# Patient Record
Sex: Female | Born: 1965 | Race: White | Hispanic: No | Marital: Married | Smoking: Former smoker
Health system: Southern US, Community
[De-identification: ages and names within clinical notes are randomized; demographics above are authoritative.]

## PROBLEM LIST (undated history)

## (undated) DIAGNOSIS — F419 Anxiety disorder, unspecified: Secondary | ICD-10-CM

## (undated) DIAGNOSIS — F329 Major depressive disorder, single episode, unspecified: Secondary | ICD-10-CM

## (undated) DIAGNOSIS — E079 Disorder of thyroid, unspecified: Secondary | ICD-10-CM

## (undated) DIAGNOSIS — C801 Malignant (primary) neoplasm, unspecified: Secondary | ICD-10-CM

## (undated) DIAGNOSIS — R011 Cardiac murmur, unspecified: Secondary | ICD-10-CM

## (undated) DIAGNOSIS — G8929 Other chronic pain: Secondary | ICD-10-CM

## (undated) DIAGNOSIS — Z87442 Personal history of urinary calculi: Secondary | ICD-10-CM

## (undated) DIAGNOSIS — I341 Nonrheumatic mitral (valve) prolapse: Secondary | ICD-10-CM

## (undated) DIAGNOSIS — J449 Chronic obstructive pulmonary disease, unspecified: Secondary | ICD-10-CM

## (undated) DIAGNOSIS — K649 Unspecified hemorrhoids: Secondary | ICD-10-CM

## (undated) DIAGNOSIS — R569 Unspecified convulsions: Secondary | ICD-10-CM

## (undated) DIAGNOSIS — J189 Pneumonia, unspecified organism: Secondary | ICD-10-CM

## (undated) DIAGNOSIS — F32A Depression, unspecified: Secondary | ICD-10-CM

## (undated) HISTORY — PX: BACK SURGERY: SHX140

## (undated) HISTORY — DX: Anxiety disorder, unspecified: F41.9

## (undated) HISTORY — DX: Chronic obstructive pulmonary disease, unspecified: J44.9

## (undated) HISTORY — PX: WISDOM TOOTH EXTRACTION: SHX21

## (undated) HISTORY — DX: Disorder of thyroid, unspecified: E07.9

---

## 2000-09-14 ENCOUNTER — Encounter: Payer: Self-pay | Admitting: *Deleted

## 2000-09-14 ENCOUNTER — Emergency Department (HOSPITAL_COMMUNITY): Admission: EM | Admit: 2000-09-14 | Discharge: 2000-09-14 | Payer: Self-pay | Admitting: *Deleted

## 2000-12-07 ENCOUNTER — Encounter: Payer: Self-pay | Admitting: Emergency Medicine

## 2000-12-07 ENCOUNTER — Emergency Department (HOSPITAL_COMMUNITY): Admission: EM | Admit: 2000-12-07 | Discharge: 2000-12-07 | Payer: Self-pay | Admitting: Emergency Medicine

## 2001-02-14 ENCOUNTER — Emergency Department (HOSPITAL_COMMUNITY): Admission: EM | Admit: 2001-02-14 | Discharge: 2001-02-14 | Payer: Self-pay | Admitting: *Deleted

## 2001-04-27 ENCOUNTER — Observation Stay (HOSPITAL_COMMUNITY): Admission: EM | Admit: 2001-04-27 | Discharge: 2001-04-27 | Payer: Self-pay | Admitting: Emergency Medicine

## 2002-11-06 ENCOUNTER — Other Ambulatory Visit: Admission: RE | Admit: 2002-11-06 | Discharge: 2002-11-06 | Payer: Self-pay | Admitting: *Deleted

## 2003-03-19 ENCOUNTER — Emergency Department (HOSPITAL_COMMUNITY): Admission: EM | Admit: 2003-03-19 | Discharge: 2003-03-19 | Payer: Self-pay | Admitting: Emergency Medicine

## 2005-11-30 ENCOUNTER — Other Ambulatory Visit: Admission: RE | Admit: 2005-11-30 | Discharge: 2005-11-30 | Payer: Self-pay | Admitting: Unknown Physician Specialty

## 2005-11-30 ENCOUNTER — Encounter (INDEPENDENT_AMBULATORY_CARE_PROVIDER_SITE_OTHER): Payer: Self-pay | Admitting: *Deleted

## 2005-11-30 ENCOUNTER — Encounter (INDEPENDENT_AMBULATORY_CARE_PROVIDER_SITE_OTHER): Payer: Self-pay | Admitting: Specialist

## 2007-02-12 ENCOUNTER — Inpatient Hospital Stay (HOSPITAL_COMMUNITY): Admission: AD | Admit: 2007-02-12 | Discharge: 2007-02-12 | Payer: Self-pay | Admitting: Obstetrics and Gynecology

## 2010-09-11 NOTE — Discharge Summary (Signed)
[facility]  Patient:    Yolanda [name], Yolanda [name] Visit Number: [id number] MRN: [id number]          Service Type: OBS Location: 4A [id number] Attending Physician:  Jonnie Kind Dictated by:   Mallory Shirk, M.D. Admit Date:  04/26/2001 Discharge Date: 04/27/2001                             Discharge Summary  OBSERVATION TIME:  10 hours on April 27, 2001.  HOSPITAL COURSE:  This 45 year old female, gravida 3, para 1, abortion 1, LMP March 17, 2001, placing her at 5-1/[redacted] weeks gestation was admitted at midnight for observation for threatened abortion after a second emergency room visit this pregnancy for complaints of abdominal pain and bleeding.  She was seen on March 23, 2001, late at [facility] and had a quantitative hCG of 493 with pelvic ultrasound showing a thin endometrium, normal adnexa with a right ovarian cyst approximately 3 cm diameter.  The patient had no evidence of ectopic pregnancy.  She was sent home and brought to the emergency room by EMS ambulance transport.  Late on the afternoon of April 26, 2001, at approximately 10 p.m. after beginning to show some bleeding and increased abdominal pain, the patient was evaluated in the emergency room with laboratory data including a hemoglobin 13, hematocrit 34.0, blood type A+, white count 8,400, with quantitative hCG 548.  Examination in the emergency room showed the cervix to be closed with menstrual type blood present. She was observed overnight for evolution of miscarriage to be considered for dilatation and curettage.  PAST MEDICAL HISTORY:  History of seizures, not currently on medicines.  PAST SURGICAL HISTORY:  Negative.  CURRENT MEDICATIONS: None.  ALLERGIES:  CODEINE causes a rash.  HABITS:  1 pack per day cigarettes.  Denies alcohol or other recreational drugs.  PHYSICAL EXAMINATION:  GENERAL:  Height 5 feet 6 inches.  Estimated weight 120 pounds.  VITAL SIGNS:   Pulse 78, respirations 18, blood pressure 135/80.  GENERAL:  She is a slim Caucasian female, obvious smoker, mild discomfort.  HEENT:  Pupils, equal, round, reactive to light and accommodation. Extraocular movements are intact.  CHEST:  Clear.  CARDIAC:  Regular rate and rhythm.  ABDOMEN:  Without masses.  EXTERNAL GENITALIA:  Normal female.  VAGINAL EXAMINATION AT 11 A.M.:  Shows increased menstrual blood and tissue fragments having extruded into the vault easily extracted from the os.  ADMITTING DIAGNOSES:  Spontaneous abortion.  PLAN:  The patient was given options of proceeding with dilatation and curettage to confirm that uterus is evacuated.  Given the ease with which tissue is being expressed and the patient is inclined to allow this miscarriage to spontaneously resolve without surgical procedure.  Blood type as mentioned is A+.  The patient will be discharged home on Motrin 800 mg IV q.8h. and Tylox 1 q.4h. p.r.n. pain, #15 tablets.  FOLLOWUP:  She will be followed up in 1 week at Dr. Awilda Bill office. Dictated by:   Mallory Shirk, M.D. Attending Physician:  Jonnie Kind DD:  04/27/01 TD:  04/27/01 Job: [id number]

## 2011-02-03 LAB — KLEIHAUER-BETKE STAIN
Fetal Cells %: 0
Quantitation Fetal Hemoglobin: 0

## 2011-02-03 LAB — CBC
HCT: 33.1 — ABNORMAL LOW
Hemoglobin: 11.7 — ABNORMAL LOW
MCHC: 35.4
MCV: 98
Platelets: 218
RBC: 3.38 — ABNORMAL LOW
RDW: 13.4
WBC: 10.6 — ABNORMAL HIGH

## 2011-03-30 ENCOUNTER — Other Ambulatory Visit: Payer: Self-pay | Admitting: Nurse Practitioner

## 2011-03-30 ENCOUNTER — Other Ambulatory Visit (HOSPITAL_COMMUNITY)
Admission: RE | Admit: 2011-03-30 | Discharge: 2011-03-30 | Disposition: A | Discharge status: Home / Self Care | Payer: Self-pay | Source: Ambulatory Visit | Attending: Unknown Physician Specialty | Admitting: Unknown Physician Specialty

## 2011-03-30 ENCOUNTER — Other Ambulatory Visit (HOSPITAL_COMMUNITY)
Admission: RE | Admit: 2011-03-30 | Discharge: 2011-03-30 | Disposition: A | Discharge status: Home / Self Care | Payer: Self-pay | Source: Ambulatory Visit | Attending: Family Medicine | Admitting: Family Medicine

## 2011-03-30 DIAGNOSIS — R87612 Low grade squamous intraepithelial lesion on cytologic smear of cervix (LGSIL): Secondary | ICD-10-CM | POA: Insufficient documentation

## 2012-01-04 ENCOUNTER — Other Ambulatory Visit (HOSPITAL_COMMUNITY)
Admission: RE | Admit: 2012-01-04 | Discharge: 2012-01-04 | Disposition: A | Discharge status: Home / Self Care | Payer: Self-pay | Source: Ambulatory Visit | Attending: Unknown Physician Specialty | Admitting: Unknown Physician Specialty

## 2012-01-04 ENCOUNTER — Ambulatory Visit (HOSPITAL_COMMUNITY): Admission: RE | Admit: 2012-01-04 | Payer: Self-pay | Source: Ambulatory Visit | Admitting: Nurse Practitioner

## 2012-01-04 DIAGNOSIS — R87610 Atypical squamous cells of undetermined significance on cytologic smear of cervix (ASC-US): Secondary | ICD-10-CM | POA: Insufficient documentation

## 2012-01-04 DIAGNOSIS — N72 Inflammatory disease of cervix uteri: Secondary | ICD-10-CM | POA: Insufficient documentation

## 2012-10-19 DIAGNOSIS — R569 Unspecified convulsions: Secondary | ICD-10-CM

## 2013-04-26 HISTORY — PX: COLONOSCOPY: SHX174

## 2015-01-21 ENCOUNTER — Encounter (HOSPITAL_COMMUNITY): Payer: Self-pay | Admitting: Emergency Medicine

## 2015-01-21 ENCOUNTER — Emergency Department (HOSPITAL_COMMUNITY)
Admission: EM | Admit: 2015-01-21 | Discharge: 2015-01-21 | Disposition: A | Discharge status: Home / Self Care | Payer: Self-pay | Attending: Emergency Medicine | Admitting: Emergency Medicine

## 2015-01-21 DIAGNOSIS — M5116 Intervertebral disc disorders with radiculopathy, lumbar region: Secondary | ICD-10-CM | POA: Insufficient documentation

## 2015-01-21 DIAGNOSIS — Z720 Tobacco use: Secondary | ICD-10-CM | POA: Insufficient documentation

## 2015-01-21 DIAGNOSIS — Z8659 Personal history of other mental and behavioral disorders: Secondary | ICD-10-CM | POA: Insufficient documentation

## 2015-01-21 DIAGNOSIS — G8929 Other chronic pain: Secondary | ICD-10-CM | POA: Insufficient documentation

## 2015-01-21 HISTORY — DX: Unspecified convulsions: R56.9

## 2015-01-21 HISTORY — DX: Major depressive disorder, single episode, unspecified: F32.9

## 2015-01-21 HISTORY — DX: Depression, unspecified: F32.A

## 2015-01-21 HISTORY — DX: Other chronic pain: G89.29

## 2015-01-21 MED ORDER — OXYCODONE-ACETAMINOPHEN 5-325 MG PO TABS: 1.0000 | ORAL_TABLET | Freq: Three times a day (TID) | ORAL | Status: DC | PRN

## 2015-01-21 MED ORDER — METHYLPREDNISOLONE 4 MG PO TBPK: ORAL_TABLET | ORAL | Status: DC

## 2015-01-21 MED ORDER — DIAZEPAM 5 MG PO TABS: 5.0000 mg | ORAL_TABLET | Freq: Two times a day (BID) | ORAL | Status: DC

## 2015-01-21 MED ORDER — DEXAMETHASONE SODIUM PHOSPHATE 10 MG/ML IJ SOLN
10.0000 mg | Freq: Once | INTRAMUSCULAR | Status: AC
  Administered 2015-01-21: 10 mg via INTRAMUSCULAR
  Filled 2015-01-21: qty 1

## 2015-01-21 NOTE — ED Provider Notes (Signed)
CSN: [id number]     Arrival date & time 01/21/15  1626 History  By signing my name below, I, Julien Nordmann, attest that this documentation has been prepared under the direction and in the presence of Etta Quill, NP. Electronically Signed: Julien Nordmann, ED Scribe. 01/21/2015. 5:06 PM.    Chief Complaint  Patient presents with  . Back Pain  . Leg Pain      Patient is a 49 y.o. female presenting with back pain and leg pain. The history is provided by the patient. No language interpreter was used.  Back Pain Location:  Unable to specify Quality:  Unable to specify Radiates to:  L posterior upper leg and L knee Pain severity:  Severe Pain is:  Unable to specify Onset quality:  Gradual Duration:  2 months Timing:  Intermittent Progression:  Worsening Chronicity:  Recurrent Context: falling   Relieved by:  Nothing Worsened by:  Ambulation and movement Associated symptoms: leg pain, numbness and weight loss   Associated symptoms: no bladder incontinence and no bowel incontinence   Leg Pain Associated symptoms: back pain    HPI Comments: Marcille Barman is a 49 y.o. female who has a hx of depression and seizures presents to the Emergency Department complaining of intermittent, gradual worsening back pain for the past 2 months. She was seen by [facility] and had an MRI done and was told she has a bulging disk that she needs operated on. Pt reports falling 3 times today due to her left leg giving out. She notes she has no feeling in her left foot. Pt ambulates with a cane. She denies bladder/bowel incontinence.   Past Medical History  Diagnosis Date  . Chronic pain   . Seizures   . Depression    History reviewed. No pertinent past surgical history. History reviewed. No pertinent family history. Social History  Substance Use Topics  . Smoking status: Current Every Day Smoker  . Smokeless tobacco: None  . Alcohol Use: No   OB History    No data available      Review of Systems  Constitutional: Positive for weight loss.  Gastrointestinal: Negative for bowel incontinence.  Genitourinary: Negative for bladder incontinence.  Musculoskeletal: Positive for back pain.  Neurological: Positive for numbness.  All other systems reviewed and are negative.     Allergies  Codeine  Home Medications   Prior to Admission medications   Not on File   Triage vitals: BP 102/72 mmHg  Pulse 79  Temp(Src) 97.9 F (36.6 C) (Oral)  Resp 18  SpO2 100% Physical Exam  Constitutional: She appears well-developed and well-nourished. No distress.  HENT:  Head: Normocephalic and atraumatic.  Eyes: Conjunctivae are normal. Pupils are equal, round, and reactive to light. Right eye exhibits no discharge. Left eye exhibits no discharge.  Neck: Neck supple.  Cardiovascular: Normal rate, regular rhythm, normal heart sounds and intact distal pulses.   Pulmonary/Chest: Effort normal and breath sounds normal. No respiratory distress.  Abdominal: Soft. There is no tenderness.  Lymphadenopathy:    She has no cervical adenopathy.  Neurological: She is alert. Coordination normal.  Skin: Skin is warm and dry. No rash noted. She is not diaphoretic.  Psychiatric: She has a normal mood and affect. Her behavior is normal.  Nursing note and vitals reviewed.   ED Course  Procedures  DIAGNOSTIC STUDIES: Oxygen Saturation is 100% on RA, normal by my interpretation.  COORDINATION OF CARE:  5:10 PM Discussed treatment plan with pt at  bedside and pt agreed to plan.  Labs Review Labs Reviewed - No data to display  Imaging Review No results found. I have personally reviewed and evaluated these images and lab results as part of my medical decision-making.   EKG Interpretation None     Patient with known history of low back pain d/t degenerative disc disease with herniation. She will need surgery, but her insurance will not be active until November.  She has been  ambulating with the assistance of a cane, but has recently been falling due to increasing pain. No bladder or bowel incontinence. Decreased strength of lower extremities bilaterally. Patient provided with rolling walker by care management. Given decadron in ED. RX for medrol dose pack, valium, and percocet. Patient is able to ambulate with assistance of walker. Return precautions discussed. MDM   Final diagnoses:  None    Lumbar radiculopathy. Degenerative disc disease with herniation.   I personally performed the services described in this documentation, which was scribed in my presence. The recorded information has been reviewed and is accurate.   Etta Quill, NP 01/22/15 0120  Virgel Manifold, MD 01/25/15 [phone]

## 2015-01-21 NOTE — Discharge Instructions (Signed)
Sciatica Sciatica is pain, weakness, numbness, or tingling along the path of the sciatic nerve. The nerve starts in the lower back and runs down the back of each leg. The nerve controls the muscles in the lower leg and in the back of the knee, while also providing sensation to the back of the thigh, lower leg, and the sole of your foot. Sciatica is a symptom of another medical condition. For instance, nerve damage or certain conditions, such as a herniated disk or bone spur on the spine, pinch or put pressure on the sciatic nerve. This causes the pain, weakness, or other sensations normally associated with sciatica. Generally, sciatica only affects one side of the body. CAUSES   Herniated or slipped disc.  Degenerative disk disease.  A pain disorder involving the narrow muscle in the buttocks (piriformis syndrome).  Pelvic injury or fracture.  Pregnancy.  Tumor (rare). SYMPTOMS  Symptoms can vary from mild to very severe. The symptoms usually travel from the low back to the buttocks and down the back of the leg. Symptoms can include:  Mild tingling or dull aches in the lower back, leg, or hip.  Numbness in the back of the calf or sole of the foot.  Burning sensations in the lower back, leg, or hip.  Sharp pains in the lower back, leg, or hip.  Leg weakness.  Severe back pain inhibiting movement. These symptoms may get worse with coughing, sneezing, laughing, or prolonged sitting or standing. Also, being overweight may worsen symptoms. DIAGNOSIS  Your caregiver will perform a physical exam to look for common symptoms of sciatica. He or she may ask you to do certain movements or activities that would trigger sciatic nerve pain. Other tests may be performed to find the cause of the sciatica. These may include:  Blood tests.  X-rays.  Imaging tests, such as an MRI or CT scan. TREATMENT  Treatment is directed at the cause of the sciatic pain. Sometimes, treatment is not necessary  and the pain and discomfort goes away on its own. If treatment is needed, your caregiver may suggest:  Over-the-counter medicines to relieve pain.  Prescription medicines, such as anti-inflammatory medicine, muscle relaxants, or narcotics.  Applying heat or ice to the painful area.  Steroid injections to lessen pain, irritation, and inflammation around the nerve.  Reducing activity during periods of pain.  Exercising and stretching to strengthen your abdomen and improve flexibility of your spine. Your caregiver may suggest losing weight if the extra weight makes the back pain worse.  Physical therapy.  Surgery to eliminate what is pressing or pinching the nerve, such as a bone spur or part of a herniated disk. HOME CARE INSTRUCTIONS   Only take over-the-counter or prescription medicines for pain or discomfort as directed by your caregiver.  Apply ice to the affected area for 20 minutes, 3-4 times a day for the first 48-72 hours. Then try heat in the same way.  Exercise, stretch, or perform your usual activities if these do not aggravate your pain.  Attend physical therapy sessions as directed by your caregiver.  Keep all follow-up appointments as directed by your caregiver.  Do not wear high heels or shoes that do not provide proper support.  Check your mattress to see if it is too soft. A firm mattress may lessen your pain and discomfort. SEEK IMMEDIATE MEDICAL CARE IF:   You lose control of your bowel or bladder (incontinence).  You have increasing weakness in the lower back, pelvis, buttocks,   or legs.  You have redness or swelling of your back.  You have a burning sensation when you urinate.  You have pain that gets worse when you lie down or awakens you at night.  Your pain is worse than you have experienced in the past.  Your pain is lasting longer than 4 weeks.  You are suddenly losing weight without reason. MAKE SURE YOU:  Understand these  instructions.  Will watch your condition.  Will get help right away if you are not doing well or get worse. Document Released: 04/06/2001 Document Revised: 10/12/2011 Document Reviewed: 08/22/2011 Exit[employer] Patient Information 2015 [employer]. This information is not intended to replace advice given to you by your health care provider. Make sure you discuss any questions you have with your health care provider.  

## 2015-01-21 NOTE — ED Notes (Signed)
Pt st's she has had lower back pain x's 2 months.  St's she was seen at [facility] and had MRI on 9/13 was told she would need surg.  Pt st;'s she can't see Neurosurgeon until Nov when her insurance starts.  Pt st's she has fallen x's 3 today due to left leg being numb

## 2015-01-21 NOTE — ED Notes (Signed)
Pt sts chronic lower back pain with sciatica; pt sts increased pain today down left side; pt sts some fall today due to pain and has some numbness; pt mae

## 2016-01-29 ENCOUNTER — Other Ambulatory Visit: Payer: Self-pay | Admitting: Advanced Practice Midwife

## 2016-02-05 ENCOUNTER — Other Ambulatory Visit: Payer: Self-pay | Admitting: Advanced Practice Midwife

## 2016-02-05 ENCOUNTER — Encounter: Payer: Self-pay | Admitting: Advanced Practice Midwife

## 2016-03-31 DIAGNOSIS — M5416 Radiculopathy, lumbar region: Secondary | ICD-10-CM | POA: Diagnosis not present

## 2016-03-31 DIAGNOSIS — M5137 Other intervertebral disc degeneration, lumbosacral region: Secondary | ICD-10-CM | POA: Diagnosis not present

## 2016-05-06 DIAGNOSIS — M5416 Radiculopathy, lumbar region: Secondary | ICD-10-CM | POA: Diagnosis not present

## 2016-05-06 DIAGNOSIS — R030 Elevated blood-pressure reading, without diagnosis of hypertension: Secondary | ICD-10-CM | POA: Diagnosis not present

## 2016-06-02 DIAGNOSIS — R030 Elevated blood-pressure reading, without diagnosis of hypertension: Secondary | ICD-10-CM | POA: Diagnosis not present

## 2016-06-02 DIAGNOSIS — M5137 Other intervertebral disc degeneration, lumbosacral region: Secondary | ICD-10-CM | POA: Diagnosis not present

## 2016-06-02 DIAGNOSIS — M5416 Radiculopathy, lumbar region: Secondary | ICD-10-CM | POA: Diagnosis not present

## 2016-08-12 DIAGNOSIS — H04123 Dry eye syndrome of bilateral lacrimal glands: Secondary | ICD-10-CM | POA: Diagnosis not present

## 2016-08-26 DIAGNOSIS — M5416 Radiculopathy, lumbar region: Secondary | ICD-10-CM | POA: Diagnosis not present

## 2016-08-26 DIAGNOSIS — M79672 Pain in left foot: Secondary | ICD-10-CM | POA: Diagnosis not present

## 2016-08-26 DIAGNOSIS — R030 Elevated blood-pressure reading, without diagnosis of hypertension: Secondary | ICD-10-CM | POA: Diagnosis not present

## 2016-08-26 DIAGNOSIS — M5137 Other intervertebral disc degeneration, lumbosacral region: Secondary | ICD-10-CM | POA: Diagnosis not present

## 2016-11-24 DIAGNOSIS — M5137 Other intervertebral disc degeneration, lumbosacral region: Secondary | ICD-10-CM | POA: Diagnosis not present

## 2016-11-24 DIAGNOSIS — R030 Elevated blood-pressure reading, without diagnosis of hypertension: Secondary | ICD-10-CM | POA: Diagnosis not present

## 2016-11-24 DIAGNOSIS — M5416 Radiculopathy, lumbar region: Secondary | ICD-10-CM | POA: Diagnosis not present

## 2017-01-28 DIAGNOSIS — Z23 Encounter for immunization: Secondary | ICD-10-CM | POA: Diagnosis not present

## 2017-02-04 DIAGNOSIS — R55 Syncope and collapse: Secondary | ICD-10-CM | POA: Diagnosis not present

## 2017-02-04 DIAGNOSIS — R569 Unspecified convulsions: Secondary | ICD-10-CM | POA: Diagnosis not present

## 2017-02-04 DIAGNOSIS — F445 Conversion disorder with seizures or convulsions: Secondary | ICD-10-CM | POA: Diagnosis not present

## 2017-02-04 DIAGNOSIS — R0789 Other chest pain: Secondary | ICD-10-CM | POA: Diagnosis not present

## 2017-02-22 DIAGNOSIS — M5416 Radiculopathy, lumbar region: Secondary | ICD-10-CM | POA: Diagnosis not present

## 2017-02-22 DIAGNOSIS — R030 Elevated blood-pressure reading, without diagnosis of hypertension: Secondary | ICD-10-CM | POA: Diagnosis not present

## 2017-02-22 DIAGNOSIS — M542 Cervicalgia: Secondary | ICD-10-CM | POA: Diagnosis not present

## 2017-02-22 DIAGNOSIS — M5137 Other intervertebral disc degeneration, lumbosacral region: Secondary | ICD-10-CM | POA: Diagnosis not present

## 2017-03-28 DIAGNOSIS — M5137 Other intervertebral disc degeneration, lumbosacral region: Secondary | ICD-10-CM | POA: Diagnosis not present

## 2017-03-28 DIAGNOSIS — M542 Cervicalgia: Secondary | ICD-10-CM | POA: Diagnosis not present

## 2017-03-28 DIAGNOSIS — R030 Elevated blood-pressure reading, without diagnosis of hypertension: Secondary | ICD-10-CM | POA: Diagnosis not present

## 2017-03-28 DIAGNOSIS — M5416 Radiculopathy, lumbar region: Secondary | ICD-10-CM | POA: Diagnosis not present

## 2017-06-20 DIAGNOSIS — M5126 Other intervertebral disc displacement, lumbar region: Secondary | ICD-10-CM | POA: Diagnosis not present

## 2017-06-20 DIAGNOSIS — M5416 Radiculopathy, lumbar region: Secondary | ICD-10-CM | POA: Diagnosis not present

## 2017-06-20 DIAGNOSIS — M542 Cervicalgia: Secondary | ICD-10-CM | POA: Diagnosis not present

## 2017-06-20 DIAGNOSIS — M5137 Other intervertebral disc degeneration, lumbosacral region: Secondary | ICD-10-CM | POA: Diagnosis not present

## 2017-06-30 DIAGNOSIS — R6882 Decreased libido: Secondary | ICD-10-CM | POA: Diagnosis not present

## 2017-06-30 DIAGNOSIS — Z01411 Encounter for gynecological examination (general) (routine) with abnormal findings: Secondary | ICD-10-CM | POA: Diagnosis not present

## 2017-06-30 DIAGNOSIS — N951 Menopausal and female climacteric states: Secondary | ICD-10-CM | POA: Diagnosis not present

## 2017-09-08 DIAGNOSIS — M5126 Other intervertebral disc displacement, lumbar region: Secondary | ICD-10-CM | POA: Diagnosis not present

## 2017-09-08 DIAGNOSIS — M542 Cervicalgia: Secondary | ICD-10-CM | POA: Diagnosis not present

## 2017-09-08 DIAGNOSIS — R030 Elevated blood-pressure reading, without diagnosis of hypertension: Secondary | ICD-10-CM | POA: Diagnosis not present

## 2017-09-08 DIAGNOSIS — M5416 Radiculopathy, lumbar region: Secondary | ICD-10-CM | POA: Diagnosis not present

## 2017-12-06 DIAGNOSIS — M5416 Radiculopathy, lumbar region: Secondary | ICD-10-CM | POA: Diagnosis not present

## 2017-12-06 DIAGNOSIS — M5126 Other intervertebral disc displacement, lumbar region: Secondary | ICD-10-CM | POA: Diagnosis not present

## 2017-12-06 DIAGNOSIS — M542 Cervicalgia: Secondary | ICD-10-CM | POA: Diagnosis not present

## 2017-12-06 DIAGNOSIS — R030 Elevated blood-pressure reading, without diagnosis of hypertension: Secondary | ICD-10-CM | POA: Diagnosis not present

## 2018-01-16 DIAGNOSIS — Z23 Encounter for immunization: Secondary | ICD-10-CM | POA: Diagnosis not present

## 2018-02-02 DIAGNOSIS — R918 Other nonspecific abnormal finding of lung field: Secondary | ICD-10-CM | POA: Diagnosis not present

## 2018-02-02 DIAGNOSIS — R05 Cough: Secondary | ICD-10-CM | POA: Diagnosis not present

## 2018-02-02 DIAGNOSIS — F17200 Nicotine dependence, unspecified, uncomplicated: Secondary | ICD-10-CM | POA: Diagnosis not present

## 2018-02-02 DIAGNOSIS — Z720 Tobacco use: Secondary | ICD-10-CM | POA: Diagnosis not present

## 2018-02-02 DIAGNOSIS — J189 Pneumonia, unspecified organism: Secondary | ICD-10-CM | POA: Diagnosis not present

## 2018-02-09 DIAGNOSIS — R911 Solitary pulmonary nodule: Secondary | ICD-10-CM | POA: Diagnosis not present

## 2018-02-09 DIAGNOSIS — R05 Cough: Secondary | ICD-10-CM | POA: Diagnosis not present

## 2018-02-09 DIAGNOSIS — R918 Other nonspecific abnormal finding of lung field: Secondary | ICD-10-CM | POA: Diagnosis not present

## 2018-02-10 DIAGNOSIS — E041 Nontoxic single thyroid nodule: Secondary | ICD-10-CM | POA: Diagnosis not present

## 2018-02-10 DIAGNOSIS — J449 Chronic obstructive pulmonary disease, unspecified: Secondary | ICD-10-CM | POA: Diagnosis not present

## 2018-02-10 DIAGNOSIS — R490 Dysphonia: Secondary | ICD-10-CM | POA: Diagnosis not present

## 2018-02-10 DIAGNOSIS — F17210 Nicotine dependence, cigarettes, uncomplicated: Secondary | ICD-10-CM | POA: Diagnosis not present

## 2018-02-10 DIAGNOSIS — C3412 Malignant neoplasm of upper lobe, left bronchus or lung: Secondary | ICD-10-CM | POA: Diagnosis not present

## 2018-02-10 DIAGNOSIS — R911 Solitary pulmonary nodule: Secondary | ICD-10-CM | POA: Diagnosis not present

## 2018-02-10 DIAGNOSIS — R05 Cough: Secondary | ICD-10-CM | POA: Diagnosis not present

## 2018-02-10 DIAGNOSIS — R918 Other nonspecific abnormal finding of lung field: Secondary | ICD-10-CM | POA: Diagnosis not present

## 2018-02-22 DIAGNOSIS — Z6825 Body mass index (BMI) 25.0-25.9, adult: Secondary | ICD-10-CM | POA: Diagnosis not present

## 2018-02-22 DIAGNOSIS — Z720 Tobacco use: Secondary | ICD-10-CM | POA: Diagnosis not present

## 2018-02-22 DIAGNOSIS — R911 Solitary pulmonary nodule: Secondary | ICD-10-CM | POA: Diagnosis not present

## 2018-02-23 ENCOUNTER — Other Ambulatory Visit (HOSPITAL_COMMUNITY): Payer: Self-pay | Admitting: Internal Medicine

## 2018-02-23 DIAGNOSIS — C3411 Malignant neoplasm of upper lobe, right bronchus or lung: Secondary | ICD-10-CM

## 2018-02-23 DIAGNOSIS — I10 Essential (primary) hypertension: Secondary | ICD-10-CM | POA: Diagnosis not present

## 2018-02-23 DIAGNOSIS — Z6824 Body mass index (BMI) 24.0-24.9, adult: Secondary | ICD-10-CM | POA: Diagnosis not present

## 2018-02-23 DIAGNOSIS — E041 Nontoxic single thyroid nodule: Secondary | ICD-10-CM | POA: Diagnosis not present

## 2018-02-27 DIAGNOSIS — E042 Nontoxic multinodular goiter: Secondary | ICD-10-CM | POA: Diagnosis not present

## 2018-02-27 DIAGNOSIS — E041 Nontoxic single thyroid nodule: Secondary | ICD-10-CM | POA: Diagnosis not present

## 2018-03-06 ENCOUNTER — Telehealth: Payer: Self-pay | Admitting: Medical Oncology

## 2018-03-06 ENCOUNTER — Encounter (HOSPITAL_COMMUNITY)
Admission: RE | Admit: 2018-03-06 | Discharge: 2018-03-06 | Disposition: A | Discharge status: Home / Self Care | Payer: BLUE CROSS/BLUE SHIELD | Source: Ambulatory Visit | Attending: Internal Medicine | Admitting: Internal Medicine

## 2018-03-06 DIAGNOSIS — R911 Solitary pulmonary nodule: Secondary | ICD-10-CM | POA: Diagnosis not present

## 2018-03-06 DIAGNOSIS — C3411 Malignant neoplasm of upper lobe, right bronchus or lung: Secondary | ICD-10-CM | POA: Diagnosis not present

## 2018-03-06 MED ORDER — FLUDEOXYGLUCOSE F - 18 (FDG) INJECTION
10.0000 | Freq: Once | INTRAVENOUS | Status: AC | PRN
  Administered 2018-03-06: 9.6 via INTRAVENOUS

## 2018-03-06 NOTE — Telephone Encounter (Signed)
err

## 2018-03-08 ENCOUNTER — Telehealth: Payer: Self-pay | Admitting: *Deleted

## 2018-03-08 DIAGNOSIS — Z6824 Body mass index (BMI) 24.0-24.9, adult: Secondary | ICD-10-CM | POA: Diagnosis not present

## 2018-03-08 DIAGNOSIS — K625 Hemorrhage of anus and rectum: Secondary | ICD-10-CM | POA: Diagnosis not present

## 2018-03-08 NOTE — Telephone Encounter (Signed)
Oncology Nurse Navigator Documentation  Oncology Nurse Navigator Flowsheets 03/08/2018  Navigator Location [facility]  Referral date to RadOnc/MedOnc 03/07/2018  Navigator Encounter Type Telephone/Ms. Whalley called yesterday and spoke with new patient coordinator.  She is very upset with [facility] care and wanted to be seen here.  I have CT scan but no other information.  Patient updated coordinator that she had PET scan.  I called her to get all information to help expedite care.  I was unable to reach her or leave vm message.    Telephone Incoming Call;Outgoing Call  Barriers/Navigation Needs Coordination of Care  Interventions Coordination of Care  Acuity Level 2  Time Spent with Patient 30

## 2018-03-09 ENCOUNTER — Telehealth: Payer: Self-pay | Admitting: *Deleted

## 2018-03-09 ENCOUNTER — Encounter: Payer: Self-pay | Admitting: *Deleted

## 2018-03-09 DIAGNOSIS — R030 Elevated blood-pressure reading, without diagnosis of hypertension: Secondary | ICD-10-CM | POA: Diagnosis not present

## 2018-03-09 DIAGNOSIS — M5416 Radiculopathy, lumbar region: Secondary | ICD-10-CM | POA: Diagnosis not present

## 2018-03-09 DIAGNOSIS — M5126 Other intervertebral disc displacement, lumbar region: Secondary | ICD-10-CM | POA: Diagnosis not present

## 2018-03-09 NOTE — Telephone Encounter (Signed)
Oncology Nurse Navigator Documentation  Oncology Nurse Navigator Flowsheets 03/09/2018  Navigator Location [facility]  Navigator Encounter Type Telephone/I called Ms. Bangs to follow up on her appt to be seen next week.  I asked about medical records and if she could fax to me.  She updated me that she has just recently seen PCP and she will get records to me from them and from recent hospital visit. She did state she went to [facility] to see "a cancer doctor" but states they did nothing to help her.  I updated her on appt for thoracic clinic next week and a fax number to fax me records.  She verbalized understanding and will get the information to me today.    Telephone Outgoing Call  Treatment Phase Abnormal Scans  Barriers/Navigation Needs Education;Coordination of Care  Education Other  Interventions Coordination of Care;Education  Coordination of Care Other  Education Method Verbal  Acuity Level 1  Time Spent with Patient 30

## 2018-03-11 DIAGNOSIS — R0602 Shortness of breath: Secondary | ICD-10-CM | POA: Diagnosis not present

## 2018-03-11 DIAGNOSIS — Z79899 Other long term (current) drug therapy: Secondary | ICD-10-CM | POA: Diagnosis not present

## 2018-03-11 DIAGNOSIS — J069 Acute upper respiratory infection, unspecified: Secondary | ICD-10-CM | POA: Diagnosis not present

## 2018-03-11 DIAGNOSIS — R918 Other nonspecific abnormal finding of lung field: Secondary | ICD-10-CM | POA: Diagnosis not present

## 2018-03-11 DIAGNOSIS — Z79891 Long term (current) use of opiate analgesic: Secondary | ICD-10-CM | POA: Diagnosis not present

## 2018-03-11 DIAGNOSIS — Z87891 Personal history of nicotine dependence: Secondary | ICD-10-CM | POA: Diagnosis not present

## 2018-03-11 DIAGNOSIS — J449 Chronic obstructive pulmonary disease, unspecified: Secondary | ICD-10-CM | POA: Diagnosis not present

## 2018-03-13 DIAGNOSIS — F331 Major depressive disorder, recurrent, moderate: Secondary | ICD-10-CM | POA: Diagnosis not present

## 2018-03-13 DIAGNOSIS — E040 Nontoxic diffuse goiter: Secondary | ICD-10-CM | POA: Diagnosis not present

## 2018-03-13 DIAGNOSIS — Z6823 Body mass index (BMI) 23.0-23.9, adult: Secondary | ICD-10-CM | POA: Diagnosis not present

## 2018-03-13 DIAGNOSIS — C3411 Malignant neoplasm of upper lobe, right bronchus or lung: Secondary | ICD-10-CM | POA: Diagnosis not present

## 2018-03-16 ENCOUNTER — Other Ambulatory Visit: Payer: Self-pay | Admitting: *Deleted

## 2018-03-16 ENCOUNTER — Encounter: Payer: Self-pay | Admitting: *Deleted

## 2018-03-16 ENCOUNTER — Institutional Professional Consult (permissible substitution) (INDEPENDENT_AMBULATORY_CARE_PROVIDER_SITE_OTHER): Payer: BLUE CROSS/BLUE SHIELD | Admitting: Thoracic Surgery (Cardiothoracic Vascular Surgery)

## 2018-03-16 ENCOUNTER — Encounter: Payer: Self-pay | Admitting: Thoracic Surgery (Cardiothoracic Vascular Surgery)

## 2018-03-16 VITALS — BP 159/72 | HR 96 | Temp 98.4°F | Resp 18 | Wt 150.8 lb

## 2018-03-16 DIAGNOSIS — R911 Solitary pulmonary nodule: Secondary | ICD-10-CM | POA: Diagnosis not present

## 2018-03-16 NOTE — Progress Notes (Signed)
Cancer conference discussion 03/16/18 documented for communication purpose. Patient was not present or seen by a physician.

## 2018-03-16 NOTE — Progress Notes (Signed)
Oncology Nurse Navigator Documentation  Oncology Nurse Navigator Flowsheets 03/16/2018  Navigator Location [facility]  Navigator Encounter Type Clinic/MDC/I spoke with patient and sister today at her appt for thoracic clinic.  I help to explain treatment plan of resection. I faxed Dr. Leonarda Salon orders to his office with confirmed fax received.   Abnormal Finding Date 02/09/2018  Multidisiplinary Clinic Date 03/16/2018  Patient Visit Type MedOnc  Treatment Phase Abnormal Scans  Barriers/Navigation Needs Education;Coordination of Care  Education Other  Interventions Coordination of Care;Education  Coordination of Care Other  Education Method Verbal  Acuity Level 2  Time Spent with Patient 30

## 2018-03-16 NOTE — Progress Notes (Signed)
PCP is Neale Burly, MD Referring Provider is Marshell Levan, MD  Dr. Federico Flake- oncology  No chief complaint on file.   HPI: Mrs. Zaucha presents for evaluation of her left upper lobe lung nodule  Arlie Posch is a 52 yo smoker with a past history of tobacco abuse (1 ppd x 35 years, quit 02/02/2018), COPD, seizures, anxiety, depression, cervical cancer, chronic back pain, and thyroid disease. She was in her usual state of health until 02/02/2018 when she presented to the ED in [place] with productive cough, shortness of breath, fever, sore throat and myalgias. A CXR showed a left upper lobe opacity. She was treated with antibiotics but had no improvement in her symptoms. She was admitted a week later after symptoms failed to improve. Her CXR was unchanged. A CT chest showed a 2.4 x 2.1 cm left upper lobe nodule. There was no adenopathy. She saw Dr. Federico Flake. A PET CT showed the nodule was hypermetabolic. There was a hypermetabolic left thyroid nodule. There was no evidence of regional or distant metastasis.  She has been very anxious about the diagnosis and wants surgery as soon as possible. She is still having some shortness of breath and also complaining of chest pain. The CP comes on randomly and last seconds to minutes. Not associated with exertion or stress. Sharp and left sided.  She is back at work.  Zubrod Score: At the time of surgery this patient's most appropriate activity status/level should be described as: [x]     0    Normal activity, no symptoms []     1    Restricted in physical strenuous activity but ambulatory, able to do out light work []     2    Ambulatory and capable of self care, unable to do work activities, up and about >50 % of waking hours                              []     3    Only limited self care, in bed greater than 50% of waking hours []     4    Completely disabled, no self care, confined to bed or chair []     5    Moribund    Past Medical History:  Diagnosis  Date  . Anxiety   . Cervical cancer ([place])   . Chronic pain   . COPD (chronic obstructive pulmonary disease) ([place])   . Depression   . Seizures ([place])   . Thyroid disease     Past Surgical History:  Procedure Laterality Date  . BACK SURGERY      History reviewed. No pertinent family history.  Social History Social History   Tobacco Use  . Smoking status: Current Every Day Smoker    Packs/day: 1.00    Years: 35.00    Pack years: 35.00    Types: Cigarettes  Substance Use Topics  . Alcohol use: No  . Drug use: No    Current Outpatient Medications  Medication Sig Dispense Refill  . diazepam (VALIUM) 5 MG tablet Take 1 tablet (5 mg total) by mouth 2 (two) times daily. 10 tablet 0  . methylPREDNISolone (MEDROL DOSEPAK) 4 MG TBPK tablet Take per package instructions 21 tablet 0  . oxyCODONE-acetaminophen (PERCOCET/ROXICET) 5-325 MG tablet Take 1 tablet by mouth every 8 (eight) hours as needed for severe pain. 15 tablet 0  . tiZANidine (ZANAFLEX) 4 MG tablet Take 4 mg by mouth  every 6 (six) hours as needed for muscle spasms.     No current facility-administered medications for this visit.     Allergies  Allergen Reactions  . Codeine Rash    Review of Systems  Constitutional: Positive for activity change. Negative for appetite change, fatigue and unexpected weight change.  HENT: Positive for voice change (hoarse x 1 year). Negative for trouble swallowing.   Eyes: Negative for visual disturbance.  Respiratory: Positive for cough and shortness of breath. Negative for wheezing.   Cardiovascular: Positive for chest pain (not exertional). Negative for leg swelling.  Genitourinary: Negative for difficulty urinating and dysuria.  Musculoskeletal: Positive for back pain.       Chronic pain- narcotic dependent  Neurological: Positive for seizures. Negative for syncope and headaches.  Hematological: Negative for adenopathy. Does not bruise/bleed easily.  Psychiatric/Behavioral:  Positive for dysphoric mood. The patient is nervous/anxious.   All other systems reviewed and are negative.   BP (!) 159/72   Pulse 96   Temp 98.4 F (36.9 C)   Resp 18   Wt 150 lb 12.8 oz (68.4 kg)   SpO2 97%  Physical Exam  Constitutional: She is oriented to person, place, and time. She appears well-developed and well-nourished. No distress.  HENT:  Head: Normocephalic and atraumatic.  hoarse  Eyes: Pupils are equal, round, and reactive to light. Conjunctivae and EOM are normal. No scleral icterus.  Neck: Normal range of motion. Neck supple. No tracheal deviation present. No thyromegaly present.  Cardiovascular: Normal rate, regular rhythm, normal heart sounds and intact distal pulses. Exam reveals no gallop and no friction rub.  No murmur heard. Pulmonary/Chest: Effort normal and breath sounds normal. No respiratory distress. She has no wheezes. She has no rales.  Abdominal: Soft. She exhibits no distension. There is no tenderness.  Musculoskeletal: She exhibits no edema.  Lymphadenopathy:    She has no cervical adenopathy.  Neurological: She is alert and oriented to person, place, and time. No cranial nerve deficit. She exhibits normal muscle tone. Coordination normal.  Skin: Skin is warm and dry.  Vitals reviewed.   Diagnostic Tests: NUCLEAR MEDICINE PET SKULL BASE TO THIGH  TECHNIQUE: 9.6 mCi F-18 FDG was injected intravenously. Full-ring PET imaging was performed from the skull base to thigh after the radiotracer. CT data was obtained and used for attenuation correction and anatomic localization.  Fasting blood glucose: 99 mg/dl  COMPARISON:  02/10/2018  FINDINGS: Mediastinal blood pool activity: SUV max 2.1  NECK: Hypermetabolic brown fat activity noted in the neck bilaterally.  Incidental CT findings: none  CHEST: The left upper lobe pulmonary nodule measures 2.8 by 2.1 cm on image 87/3 and has a maximum standard uptake value of 12.1, compatible  with malignancy. The peripheral 4 mm nodule shown on prior chest CT is not measurably hypermetabolic today, but is below sensitive PET-CT size thresholds.  Numerous foci of hypermetabolic brown fat are scattered in the upper chest, paraspinal region, and mediastinum. No appreciable pathologically enlarged/hypermetabolic adenopathy.  Incidental CT findings: none  ABDOMEN/PELVIS: Anal activity observed without obvious CT correlate, probably physiologic but technically nonspecific.  Incidental CT findings: Punctate 2 mm left kidney lower pole nonobstructive calculus. Probable similar punctate calculus in the right kidney lower pole.  Aortoiliac atherosclerotic vascular disease.  SKELETON: No significant abnormal hypermetabolic activity in this region.  Incidental CT findings: none  IMPRESSION: 1. The left upper lobe pulmonary nodule has a maximum SUV of 12.1, compatible with malignancy. No pathologically enlarged/hypermetabolic adenopathy identified. 2.  Considerable amount of hypermetabolic brown fat in the neck and chest, benign. 3. Anal activity observed without obvious CT correlate, probably physiologic but technically nonspecific. 4. Suspected nonobstructive nephrolithiasis. 5.  Aortic Atherosclerosis (ICD10-I70.0).   Electronically Signed   By: Van Clines M.D.   On: 03/07/2018 09:05 I personally reviewed the CT and PET CT images and concur with the findings noted above. We also reviewed the films in our multidisciplinary conference  Impression: 52 yo woman with a history of tobacco abuse who presents with a cough and shortness of breath. Found to have a left upper lobe lung nodule that is hypermetabolic on PET. C/w a clinical stage IA lung cancer (T1N0). Infectious and inflammatory nodules are also in the differential but are far less likely and this has to be considered lung cancer unless it can be proven otherwise.  It is not clear what the source of  her dyspnea is. She has a diagnosis of COPD but little emphysema on CT. Her chest pain is also atypical. It does not sound anginal in nature, I suspect there is a component of anxiety contributing. She needs PFTs to see if she has adequate reserve to tolerate a resection. I suspect she will.  I recommended we proceed with Left VATS for left upper lobectomy. I discussed the general nature of the procedure, the need for general anesthesia, the incisions to be used, and the use of a drainage tube postoperatively with Mrs. Hardrick and her sister. We discussed the expected hospital stay, overall recovery and short and long term outcomes. I informed her of the indications, risks, benefits and alternatives. She understands the risks include, but are not limited to death, stroke, MI, DVT/PE, bleeding, possible need for transfusion, infections, prolonged air leak, cardiac arrhythmias, chronic pain or paresthesias, as well as other organ system dysfunction including respiratory, renal, or GI complications.  She accepts the risks and agrees to proceed.  Plan: PFT with and without bronchodilators Left VATS, left upper lobectomy on Friday 12//09/2017  [name], MD [facility] [phone]

## 2018-03-16 NOTE — H&P (View-Only) (Signed)
PCP is Neale Burly, MD Referring Provider is Marshell Levan, MD  Dr. Federico Flake- oncology  No chief complaint on file.   HPI: Yolanda [name] presents for evaluation of her left upper lobe lung nodule  Yolanda [name] is a 52 yo smoker with a past history of tobacco abuse (1 ppd x 35 years, quit 02/02/2018), COPD, seizures, anxiety, depression, cervical cancer, chronic back pain, and thyroid disease. She was in her usual state of health until 02/02/2018 when she presented to the ED in [place] with productive cough, shortness of breath, fever, sore throat and myalgias. A CXR showed a left upper lobe opacity. She was treated with antibiotics but had no improvement in her symptoms. She was admitted a week later after symptoms failed to improve. Her CXR was unchanged. A CT chest showed a 2.4 x 2.1 cm left upper lobe nodule. There was no adenopathy. She saw Dr. Federico Flake. A PET CT showed the nodule was hypermetabolic. There was a hypermetabolic left thyroid nodule. There was no evidence of regional or distant metastasis.  She has been very anxious about the diagnosis and wants surgery as soon as possible. She is still having some shortness of breath and also complaining of chest pain. The CP comes on randomly and last seconds to minutes. Not associated with exertion or stress. Sharp and left sided.  She is back at work.  Zubrod Score: At the time of surgery this patient's most appropriate activity status/level should be described as: [x]     0    Normal activity, no symptoms []     1    Restricted in physical strenuous activity but ambulatory, able to do out light work []     2    Ambulatory and capable of self care, unable to do work activities, up and about >50 % of waking hours                              []     3    Only limited self care, in bed greater than 50% of waking hours []     4    Completely disabled, no self care, confined to bed or chair []     5    Moribund    Past Medical History:  Diagnosis  Date  . Anxiety   . Cervical cancer ([place])   . Chronic pain   . COPD (chronic obstructive pulmonary disease) ([place])   . Depression   . Seizures ([place])   . Thyroid disease     Past Surgical History:  Procedure Laterality Date  . BACK SURGERY      History reviewed. No pertinent family history.  Social History Social History   Tobacco Use  . Smoking status: Current Every Day Smoker    Packs/day: 1.00    Years: 35.00    Pack years: 35.00    Types: Cigarettes  Substance Use Topics  . Alcohol use: No  . Drug use: No    Current Outpatient Medications  Medication Sig Dispense Refill  . diazepam (VALIUM) 5 MG tablet Take 1 tablet (5 mg total) by mouth 2 (two) times daily. 10 tablet 0  . methylPREDNISolone (MEDROL DOSEPAK) 4 MG TBPK tablet Take per package instructions 21 tablet 0  . oxyCODONE-acetaminophen (PERCOCET/ROXICET) 5-325 MG tablet Take 1 tablet by mouth every 8 (eight) hours as needed for severe pain. 15 tablet 0  . tiZANidine (ZANAFLEX) 4 MG tablet Take 4 mg by mouth  every 6 (six) hours as needed for muscle spasms.     No current facility-administered medications for this visit.     Allergies  Allergen Reactions  . Codeine Rash    Review of Systems  Constitutional: Positive for activity change. Negative for appetite change, fatigue and unexpected weight change.  HENT: Positive for voice change (hoarse x 1 year). Negative for trouble swallowing.   Eyes: Negative for visual disturbance.  Respiratory: Positive for cough and shortness of breath. Negative for wheezing.   Cardiovascular: Positive for chest pain (not exertional). Negative for leg swelling.  Genitourinary: Negative for difficulty urinating and dysuria.  Musculoskeletal: Positive for back pain.       Chronic pain- narcotic dependent  Neurological: Positive for seizures. Negative for syncope and headaches.  Hematological: Negative for adenopathy. Does not bruise/bleed easily.  Psychiatric/Behavioral:  Positive for dysphoric mood. The patient is nervous/anxious.   All other systems reviewed and are negative.   BP (!) 159/72   Pulse 96   Temp 98.4 F (36.9 C)   Resp 18   Wt 150 lb 12.8 oz (68.4 kg)   SpO2 97%  Physical Exam  Constitutional: She is oriented to person, place, and time. She appears well-developed and well-nourished. No distress.  HENT:  Head: Normocephalic and atraumatic.  hoarse  Eyes: Pupils are equal, round, and reactive to light. Conjunctivae and EOM are normal. No scleral icterus.  Neck: Normal range of motion. Neck supple. No tracheal deviation present. No thyromegaly present.  Cardiovascular: Normal rate, regular rhythm, normal heart sounds and intact distal pulses. Exam reveals no gallop and no friction rub.  No murmur heard. Pulmonary/Chest: Effort normal and breath sounds normal. No respiratory distress. She has no wheezes. She has no rales.  Abdominal: Soft. She exhibits no distension. There is no tenderness.  Musculoskeletal: She exhibits no edema.  Lymphadenopathy:    She has no cervical adenopathy.  Neurological: She is alert and oriented to person, place, and time. No cranial nerve deficit. She exhibits normal muscle tone. Coordination normal.  Skin: Skin is warm and dry.  Vitals reviewed.   Diagnostic Tests: NUCLEAR MEDICINE PET SKULL BASE TO THIGH  TECHNIQUE: 9.6 mCi F-18 FDG was injected intravenously. Full-ring PET imaging was performed from the skull base to thigh after the radiotracer. CT data was obtained and used for attenuation correction and anatomic localization.  Fasting blood glucose: 99 mg/dl  COMPARISON:  02/10/2018  FINDINGS: Mediastinal blood pool activity: SUV max 2.1  NECK: Hypermetabolic brown fat activity noted in the neck bilaterally.  Incidental CT findings: none  CHEST: The left upper lobe pulmonary nodule measures 2.8 by 2.1 cm on image 87/3 and has a maximum standard uptake value of 12.1, compatible  with malignancy. The peripheral 4 mm nodule shown on prior chest CT is not measurably hypermetabolic today, but is below sensitive PET-CT size thresholds.  Numerous foci of hypermetabolic brown fat are scattered in the upper chest, paraspinal region, and mediastinum. No appreciable pathologically enlarged/hypermetabolic adenopathy.  Incidental CT findings: none  ABDOMEN/PELVIS: Anal activity observed without obvious CT correlate, probably physiologic but technically nonspecific.  Incidental CT findings: Punctate 2 mm left kidney lower pole nonobstructive calculus. Probable similar punctate calculus in the right kidney lower pole.  Aortoiliac atherosclerotic vascular disease.  SKELETON: No significant abnormal hypermetabolic activity in this region.  Incidental CT findings: none  IMPRESSION: 1. The left upper lobe pulmonary nodule has a maximum SUV of 12.1, compatible with malignancy. No pathologically enlarged/hypermetabolic adenopathy identified. 2.  Considerable amount of hypermetabolic brown fat in the neck and chest, benign. 3. Anal activity observed without obvious CT correlate, probably physiologic but technically nonspecific. 4. Suspected nonobstructive nephrolithiasis. 5.  Aortic Atherosclerosis (ICD10-I70.0).   Electronically Signed   By: Van Clines M.D.   On: 03/07/2018 09:05 I personally reviewed the CT and PET CT images and concur with the findings noted above. We also reviewed the films in our multidisciplinary conference  Impression: 52 yo woman with a history of tobacco abuse who presents with a cough and shortness of breath. Found to have a left upper lobe lung nodule that is hypermetabolic on PET. C/w a clinical stage IA lung cancer (T1N0). Infectious and inflammatory nodules are also in the differential but are far less likely and this has to be considered lung cancer unless it can be proven otherwise.  It is not clear what the source of  her dyspnea is. She has a diagnosis of COPD but little emphysema on CT. Her chest pain is also atypical. It does not sound anginal in nature, I suspect there is a component of anxiety contributing. She needs PFTs to see if she has adequate reserve to tolerate a resection. I suspect she will.  I recommended we proceed with Left VATS for left upper lobectomy. I discussed the general nature of the procedure, the need for general anesthesia, the incisions to be used, and the use of a drainage tube postoperatively with Mrs. Novelo and her sister. We discussed the expected hospital stay, overall recovery and short and long term outcomes. I informed her of the indications, risks, benefits and alternatives. She understands the risks include, but are not limited to death, stroke, MI, DVT/PE, bleeding, possible need for transfusion, infections, prolonged air leak, cardiac arrhythmias, chronic pain or paresthesias, as well as other organ system dysfunction including respiratory, renal, or GI complications.  She accepts the risks and agrees to proceed.  Plan: PFT with and without bronchodilators Left VATS, left upper lobectomy on Friday 12//09/2017  [name], MD [facility] [phone]

## 2018-03-17 ENCOUNTER — Other Ambulatory Visit: Payer: Self-pay | Admitting: *Deleted

## 2018-03-17 ENCOUNTER — Encounter: Payer: Self-pay | Admitting: *Deleted

## 2018-03-17 DIAGNOSIS — R911 Solitary pulmonary nodule: Secondary | ICD-10-CM

## 2018-03-29 ENCOUNTER — Ambulatory Visit (HOSPITAL_COMMUNITY)
Admission: RE | Admit: 2018-03-29 | Discharge: 2018-03-29 | Disposition: A | Discharge status: Home / Self Care | Payer: BLUE CROSS/BLUE SHIELD | Source: Ambulatory Visit | Attending: Thoracic Surgery (Cardiothoracic Vascular Surgery) | Admitting: Thoracic Surgery (Cardiothoracic Vascular Surgery)

## 2018-03-29 ENCOUNTER — Encounter (HOSPITAL_COMMUNITY)
Admission: RE | Admit: 2018-03-29 | Discharge: 2018-03-29 | Disposition: A | Discharge status: Home / Self Care | Payer: BLUE CROSS/BLUE SHIELD | Source: Ambulatory Visit | Attending: Thoracic Surgery (Cardiothoracic Vascular Surgery) | Admitting: Thoracic Surgery (Cardiothoracic Vascular Surgery)

## 2018-03-29 ENCOUNTER — Other Ambulatory Visit: Payer: Self-pay

## 2018-03-29 ENCOUNTER — Encounter (HOSPITAL_COMMUNITY): Payer: Self-pay

## 2018-03-29 DIAGNOSIS — R918 Other nonspecific abnormal finding of lung field: Secondary | ICD-10-CM | POA: Diagnosis not present

## 2018-03-29 DIAGNOSIS — Z01818 Encounter for other preprocedural examination: Secondary | ICD-10-CM | POA: Insufficient documentation

## 2018-03-29 DIAGNOSIS — R911 Solitary pulmonary nodule: Secondary | ICD-10-CM

## 2018-03-29 DIAGNOSIS — I498 Other specified cardiac arrhythmias: Secondary | ICD-10-CM | POA: Insufficient documentation

## 2018-03-29 HISTORY — DX: Personal history of urinary calculi: Z87.442

## 2018-03-29 HISTORY — DX: Unspecified hemorrhoids: K64.9

## 2018-03-29 HISTORY — DX: Pneumonia, unspecified organism: J18.9

## 2018-03-29 HISTORY — DX: Cardiac murmur, unspecified: R01.1

## 2018-03-29 HISTORY — DX: Nonrheumatic mitral (valve) prolapse: I34.1

## 2018-03-29 LAB — CBC
HCT: 38.8 % (ref 36.0–46.0)
Hemoglobin: 12.5 g/dL (ref 12.0–15.0)
MCH: 31.1 pg (ref 26.0–34.0)
MCHC: 32.2 g/dL (ref 30.0–36.0)
MCV: 96.5 fL (ref 80.0–100.0)
Platelets: 272 10*3/uL (ref 150–400)
RBC: 4.02 MIL/uL (ref 3.87–5.11)
RDW: 12.1 % (ref 11.5–15.5)
WBC: 4.3 10*3/uL (ref 4.0–10.5)
nRBC: 0 % (ref 0.0–0.2)

## 2018-03-29 LAB — URINALYSIS, ROUTINE W REFLEX MICROSCOPIC
Bilirubin Urine: NEGATIVE
Glucose, UA: NEGATIVE mg/dL
Ketones, ur: NEGATIVE mg/dL
Leukocytes, UA: NEGATIVE
Nitrite: NEGATIVE
Protein, ur: NEGATIVE mg/dL
Specific Gravity, Urine: 1.011 (ref 1.005–1.030)
pH: 6 (ref 5.0–8.0)

## 2018-03-29 LAB — TYPE AND SCREEN
ABO/RH(D): A POS
Antibody Screen: NEGATIVE

## 2018-03-29 LAB — SURGICAL PCR SCREEN
MRSA, PCR: NEGATIVE
Staphylococcus aureus: NEGATIVE

## 2018-03-29 LAB — PULMONARY FUNCTION TEST
DL/VA % pred: 90 %
DL/VA: 4.37 ml/min/mmHg/L
DLCO unc % pred: 79 %
DLCO unc: 19.21 ml/min/mmHg
FEF 25-75 Post: 3.25 L/sec
FEF 25-75 Pre: 2.67 L/sec
FEF2575-%Change-Post: 21 %
FEF2575-%Pred-Post: 121 %
FEF2575-%Pred-Pre: 99 %
FEV1-%Change-Post: 3 %
FEV1-%Pred-Post: 94 %
FEV1-%Pred-Pre: 90 %
FEV1-Post: 2.6 L
FEV1-Pre: 2.5 L
FEV1FVC-%Change-Post: 4 %
FEV1FVC-%Pred-Pre: 102 %
FEV6-%Change-Post: 0 %
FEV6-%Pred-Post: 89 %
FEV6-%Pred-Pre: 89 %
FEV6-Post: 3.04 L
FEV6-Pre: 3.05 L
FEV6FVC-%Change-Post: 0 %
FEV6FVC-%Pred-Post: 102 %
FEV6FVC-%Pred-Pre: 102 %
FVC-%Change-Post: 0 %
FVC-%Pred-Post: 86 %
FVC-%Pred-Pre: 87 %
FVC-Post: 3.04 L
FVC-Pre: 3.07 L
Post FEV1/FVC ratio: 85 %
Post FEV6/FVC ratio: 100 %
Pre FEV1/FVC ratio: 82 %
Pre FEV6/FVC Ratio: 100 %
RV % pred: 74 %
RV: 1.35 L
TLC % pred: 89 %
TLC: 4.5 L

## 2018-03-29 LAB — COMPREHENSIVE METABOLIC PANEL
ALT: 25 U/L (ref 0–44)
AST: 26 U/L (ref 15–41)
Albumin: 4 g/dL (ref 3.5–5.0)
Alkaline Phosphatase: 86 U/L (ref 38–126)
Anion gap: 11 (ref 5–15)
BUN: 12 mg/dL (ref 6–20)
CO2: 25 mmol/L (ref 22–32)
Calcium: 9.3 mg/dL (ref 8.9–10.3)
Chloride: 106 mmol/L (ref 98–111)
Creatinine, Ser: 0.65 mg/dL (ref 0.44–1.00)
GFR calc Af Amer: >60 mL/min (ref >60)
GFR calc non Af Amer: >60 mL/min (ref >60)
Glucose, Bld: 107 mg/dL — ABNORMAL HIGH (ref 70–99)
Potassium: 3.7 mmol/L (ref 3.5–5.1)
Sodium: 142 mmol/L (ref 135–145)
Total Bilirubin: 0.3 mg/dL (ref 0.3–1.2)
Total Protein: 7.1 g/dL (ref 6.5–8.1)

## 2018-03-29 LAB — APTT: aPTT: 27 seconds (ref 24–36)

## 2018-03-29 LAB — ABO/RH: ABO/RH(D): A POS

## 2018-03-29 LAB — PROTIME-INR
INR: 0.94
Prothrombin Time: 12.5 seconds (ref 11.4–15.2)

## 2018-03-29 MED ORDER — ALBUTEROL SULFATE (2.5 MG/3ML) 0.083% IN NEBU
2.5000 mg | INHALATION_SOLUTION | Freq: Once | RESPIRATORY_TRACT | Status: AC
  Administered 2018-03-29: 2.5 mg via RESPIRATORY_TRACT

## 2018-03-29 NOTE — Progress Notes (Signed)
PCP - Volanda Napoleon MD  Chest x-ray - 03/29/18 EKG - 03/29/18  Blood Thinner Instructions: N/A Aspirin Instructions: N/A  Anesthesia review: none  Patient denies shortness of breath, fever, cough and chest pain at PAT appointment   Patient verbalized understanding of instructions that were given to them at the PAT appointment. Patient was also instructed that they will need to review over the PAT instructions again at home before surgery.

## 2018-03-29 NOTE — Pre-Procedure Instructions (Signed)
Yolanda [name]  03/29/2018      [facility] - [address], [place], [place] - [address] [place] [address] Phone: [phone] Fax: [phone]    Your procedure is scheduled on Friday December 6th.  Report to [facility] Admitting at 0700 A.M.  Call this number if you have problems the morning of surgery:  [phone]   Remember:  Do not eat or drink after midnight.    Take these medicines the morning of surgery with A SIP OF WATER  albuterol (PROVENTIL HFA;VENTOLIN HFA) If needed. Bring with you to hospital levothyroxine (SYNTHROID, LEVOTHROID) oxyCODONE-acetaminophen (PERCOCET) if needed  7 days prior to surgery STOP taking any Aspirin(unless otherwise instructed by your surgeon), Aleve, Naproxen, Ibuprofen, Motrin, Advil, Goody's, BC's, all herbal medications, fish oil, and all vitamins     Do not wear jewelry, make-up or nail polish.  Do not wear lotions, powders, or perfumes, or deodorant.  Do not shave 48 hours prior to surgery.  Men may shave face and neck.  Do not bring valuables to the hospital.  [facility] is not responsible for any belongings or valuables.  Contacts, dentures or bridgework may not be worn into surgery.  Leave your suitcase in the car.  After surgery it may be brought to your room.  For patients admitted to the hospital, discharge time will be determined by your treatment team.  Patients discharged the day of surgery will not be allowed to drive home.    [place]- Preparing For Surgery  Before surgery, you can play an important role. Because skin is not sterile, your skin needs to be as free of germs as possible. You can reduce the number of germs on your skin by washing with CHG (chlorahexidine gluconate) Soap before surgery.  CHG is an antiseptic cleaner which kills germs and bonds with the skin to continue killing germs even after washing.    Oral Hygiene is also important to reduce your risk of  infection.  Remember - BRUSH YOUR TEETH THE MORNING OF SURGERY WITH YOUR REGULAR TOOTHPASTE  Please do not use if you have an allergy to CHG or antibacterial soaps. If your skin becomes reddened/irritated stop using the CHG.  Do not shave (including legs and underarms) for at least 48 hours prior to first CHG shower. It is OK to shave your face.  Please follow these instructions carefully.   1. Shower the NIGHT BEFORE SURGERY and the MORNING OF SURGERY with CHG.   2. If you chose to wash your hair, wash your hair first as usual with your normal shampoo.  3. After you shampoo, rinse your hair and body thoroughly to remove the shampoo.  4. Use CHG as you would any other liquid soap. You can apply CHG directly to the skin and wash gently with a scrungie or a clean washcloth.   5. Apply the CHG Soap to your body ONLY FROM THE NECK DOWN.  Do not use on open wounds or open sores. Avoid contact with your eyes, ears, mouth and genitals (private parts). Wash Face and genitals (private parts)  with your normal soap.  6. Wash thoroughly, paying special attention to the area where your surgery will be performed.  7. Thoroughly rinse your body with warm water from the neck down.  8. DO NOT shower/wash with your normal soap after using and rinsing off the CHG Soap.  9. Pat yourself dry with a CLEAN TOWEL.  10. Wear CLEAN PAJAMAS to bed the night before surgery, wear comfortable clothes the morning of surgery  11. Place CLEAN SHEETS on your bed the night of your first shower and DO NOT SLEEP WITH PETS.    Day of Surgery:  Do not apply any deodorants/lotions.  Please wear clean clothes to the hospital/surgery center.   Remember to brush your teeth WITH YOUR REGULAR TOOTHPASTE.    Please read over the following fact sheets that you were given.

## 2018-03-31 ENCOUNTER — Inpatient Hospital Stay (HOSPITAL_COMMUNITY): Payer: BLUE CROSS/BLUE SHIELD

## 2018-03-31 ENCOUNTER — Encounter (HOSPITAL_COMMUNITY): Payer: Self-pay

## 2018-03-31 ENCOUNTER — Inpatient Hospital Stay (HOSPITAL_COMMUNITY)
Admission: RE | Admit: 2018-03-31 | Discharge: 2018-04-03 | DRG: 164 | Disposition: A | Discharge status: Home / Self Care | Payer: BLUE CROSS/BLUE SHIELD | Attending: Thoracic Surgery (Cardiothoracic Vascular Surgery) | Admitting: Thoracic Surgery (Cardiothoracic Vascular Surgery)

## 2018-03-31 ENCOUNTER — Inpatient Hospital Stay (HOSPITAL_COMMUNITY): Payer: BLUE CROSS/BLUE SHIELD | Admitting: Anesthesiology

## 2018-03-31 ENCOUNTER — Inpatient Hospital Stay (HOSPITAL_COMMUNITY): Payer: BLUE CROSS/BLUE SHIELD | Admitting: Vascular Surgery

## 2018-03-31 ENCOUNTER — Encounter (HOSPITAL_COMMUNITY)
Admission: RE | Disposition: A | Discharge status: Home / Self Care | Payer: Self-pay | Source: Home / Self Care | Attending: Thoracic Surgery (Cardiothoracic Vascular Surgery)

## 2018-03-31 ENCOUNTER — Other Ambulatory Visit: Payer: Self-pay

## 2018-03-31 DIAGNOSIS — I341 Nonrheumatic mitral (valve) prolapse: Secondary | ICD-10-CM | POA: Diagnosis not present

## 2018-03-31 DIAGNOSIS — J9382 Other air leak: Secondary | ICD-10-CM | POA: Diagnosis present

## 2018-03-31 DIAGNOSIS — J939 Pneumothorax, unspecified: Secondary | ICD-10-CM

## 2018-03-31 DIAGNOSIS — G8929 Other chronic pain: Secondary | ICD-10-CM | POA: Diagnosis not present

## 2018-03-31 DIAGNOSIS — Z885 Allergy status to narcotic agent status: Secondary | ICD-10-CM

## 2018-03-31 DIAGNOSIS — Z79899 Other long term (current) drug therapy: Secondary | ICD-10-CM

## 2018-03-31 DIAGNOSIS — C3412 Malignant neoplasm of upper lobe, left bronchus or lung: Principal | ICD-10-CM | POA: Diagnosis present

## 2018-03-31 DIAGNOSIS — Z8541 Personal history of malignant neoplasm of cervix uteri: Secondary | ICD-10-CM

## 2018-03-31 DIAGNOSIS — F329 Major depressive disorder, single episode, unspecified: Secondary | ICD-10-CM | POA: Diagnosis not present

## 2018-03-31 DIAGNOSIS — F419 Anxiety disorder, unspecified: Secondary | ICD-10-CM | POA: Diagnosis present

## 2018-03-31 DIAGNOSIS — D62 Acute posthemorrhagic anemia: Secondary | ICD-10-CM | POA: Diagnosis present

## 2018-03-31 DIAGNOSIS — F17200 Nicotine dependence, unspecified, uncomplicated: Secondary | ICD-10-CM | POA: Diagnosis not present

## 2018-03-31 DIAGNOSIS — Z79891 Long term (current) use of opiate analgesic: Secondary | ICD-10-CM

## 2018-03-31 DIAGNOSIS — J449 Chronic obstructive pulmonary disease, unspecified: Secondary | ICD-10-CM | POA: Diagnosis not present

## 2018-03-31 DIAGNOSIS — Z4682 Encounter for fitting and adjustment of non-vascular catheter: Secondary | ICD-10-CM

## 2018-03-31 DIAGNOSIS — J9811 Atelectasis: Secondary | ICD-10-CM | POA: Diagnosis not present

## 2018-03-31 DIAGNOSIS — E039 Hypothyroidism, unspecified: Secondary | ICD-10-CM | POA: Diagnosis not present

## 2018-03-31 DIAGNOSIS — R911 Solitary pulmonary nodule: Secondary | ICD-10-CM | POA: Diagnosis not present

## 2018-03-31 DIAGNOSIS — Z959 Presence of cardiac and vascular implant and graft, unspecified: Secondary | ICD-10-CM | POA: Diagnosis not present

## 2018-03-31 DIAGNOSIS — Z902 Acquired absence of lung [part of]: Secondary | ICD-10-CM

## 2018-03-31 HISTORY — PX: VIDEO ASSISTED THORACOSCOPY (VATS)/ LOBECTOMY: SHX6169

## 2018-03-31 LAB — BLOOD GAS, ARTERIAL
Acid-Base Excess: 1.3 mmol/L (ref 0.0–2.0)
Bicarbonate: 25.3 mmol/L (ref 20.0–28.0)
Drawn by: 470591
FIO2: 21
O2 Saturation: 99.1 %
Patient temperature: 98.6
pCO2 arterial: 39.3 mmHg (ref 32.0–48.0)
pH, Arterial: 7.424 (ref 7.350–7.450)
pO2, Arterial: 158 mmHg — ABNORMAL HIGH (ref 83.0–108.0)

## 2018-03-31 LAB — GLUCOSE, CAPILLARY: Glucose-Capillary: 190 mg/dL — ABNORMAL HIGH (ref 70–99)

## 2018-03-31 SURGERY — VIDEO ASSISTED THORACOSCOPY (VATS)/ LOBECTOMY
Anesthesia: General | Site: Chest | Laterality: Left

## 2018-03-31 MED ORDER — INSULIN ASPART 100 UNIT/ML ~~LOC~~ SOLN
0.0000 [IU] | SUBCUTANEOUS | Status: DC
  Administered 2018-03-31 – 2018-04-01 (×2): 4 [IU] via SUBCUTANEOUS

## 2018-03-31 MED ORDER — SODIUM CHLORIDE 0.9 % IV SOLN
INTRAVENOUS | Status: DC | PRN
  Administered 2018-03-31: 25 ug/min via INTRAVENOUS

## 2018-03-31 MED ORDER — ONDANSETRON HCL 4 MG/2ML IJ SOLN
INTRAMUSCULAR | Status: DC | PRN
  Administered 2018-03-31: 4 mg via INTRAVENOUS

## 2018-03-31 MED ORDER — MIDAZOLAM HCL 2 MG/2ML IJ SOLN
INTRAMUSCULAR | Status: AC
  Administered 2018-03-31: 2 mg via INTRAVENOUS
  Filled 2018-03-31: qty 2

## 2018-03-31 MED ORDER — FENTANYL CITRATE (PF) 100 MCG/2ML IJ SOLN
25.0000 ug | INTRAMUSCULAR | Status: DC | PRN
  Administered 2018-03-31 (×3): 50 ug via INTRAVENOUS

## 2018-03-31 MED ORDER — TRAMADOL HCL 50 MG PO TABS
50.0000 mg | ORAL_TABLET | Freq: Four times a day (QID) | ORAL | Status: DC | PRN
  Administered 2018-04-01 – 2018-04-02 (×4): 100 mg via ORAL
  Administered 2018-04-02: 50 mg via ORAL
  Administered 2018-04-03: 100 mg via ORAL
  Filled 2018-03-31: qty 2
  Filled 2018-03-31: qty 1
  Filled 2018-03-31 (×4): qty 2

## 2018-03-31 MED ORDER — ESMOLOL HCL 100 MG/10ML IV SOLN
INTRAVENOUS | Status: DC | PRN
  Administered 2018-03-31: 20 mg via INTRAVENOUS

## 2018-03-31 MED ORDER — PROPOFOL 10 MG/ML IV BOLUS
INTRAVENOUS | Status: DC | PRN
  Administered 2018-03-31: 140 mg via INTRAVENOUS
  Administered 2018-03-31 (×3): 30 mg via INTRAVENOUS

## 2018-03-31 MED ORDER — ONDANSETRON HCL 4 MG/2ML IJ SOLN
INTRAMUSCULAR | Status: AC
  Filled 2018-03-31: qty 2

## 2018-03-31 MED ORDER — ROCURONIUM BROMIDE 50 MG/5ML IV SOSY
PREFILLED_SYRINGE | INTRAVENOUS | Status: AC
  Filled 2018-03-31: qty 10

## 2018-03-31 MED ORDER — EPHEDRINE SULFATE 50 MG/ML IJ SOLN
INTRAMUSCULAR | Status: DC | PRN
  Administered 2018-03-31 (×2): 5 mg via INTRAVENOUS

## 2018-03-31 MED ORDER — PHENYLEPHRINE 40 MCG/ML (10ML) SYRINGE FOR IV PUSH (FOR BLOOD PRESSURE SUPPORT)
PREFILLED_SYRINGE | INTRAVENOUS | Status: AC
  Filled 2018-03-31: qty 10

## 2018-03-31 MED ORDER — BUPIVACAINE LIPOSOME 1.3 % IJ SUSP
20.0000 mL | Freq: Once | INTRAMUSCULAR | Status: DC
  Filled 2018-03-31: qty 20

## 2018-03-31 MED ORDER — ALBUTEROL SULFATE (2.5 MG/3ML) 0.083% IN NEBU: 2.5000 mg | INHALATION_SOLUTION | RESPIRATORY_TRACT | Status: DC | PRN

## 2018-03-31 MED ORDER — BUPIVACAINE LIPOSOME 1.3 % IJ SUSP
INTRAMUSCULAR | Status: DC | PRN
  Administered 2018-03-31: 14:00:00

## 2018-03-31 MED ORDER — MIDAZOLAM HCL 2 MG/2ML IJ SOLN
2.0000 mg | Freq: Once | INTRAMUSCULAR | Status: AC
  Administered 2018-03-31: 2 mg via INTRAVENOUS

## 2018-03-31 MED ORDER — ROCURONIUM BROMIDE 10 MG/ML (PF) SYRINGE
PREFILLED_SYRINGE | INTRAVENOUS | Status: DC | PRN
  Administered 2018-03-31: 50 mg via INTRAVENOUS
  Administered 2018-03-31 (×2): 20 mg via INTRAVENOUS

## 2018-03-31 MED ORDER — LACTATED RINGERS IV SOLN
INTRAVENOUS | Status: DC
  Administered 2018-03-31 (×2): via INTRAVENOUS

## 2018-03-31 MED ORDER — SODIUM CHLORIDE 0.9 % IV SOLN
INTRAVENOUS | Status: DC
  Administered 2018-03-31: 19:00:00 via INTRAVENOUS

## 2018-03-31 MED ORDER — FENTANYL CITRATE (PF) 250 MCG/5ML IJ SOLN
INTRAMUSCULAR | Status: DC | PRN
  Administered 2018-03-31: 50 ug via INTRAVENOUS
  Administered 2018-03-31: 100 ug via INTRAVENOUS
  Administered 2018-03-31 (×2): 50 ug via INTRAVENOUS

## 2018-03-31 MED ORDER — CEFAZOLIN SODIUM-DEXTROSE 2-4 GM/100ML-% IV SOLN
2.0000 g | INTRAVENOUS | Status: AC
  Administered 2018-03-31: 2 g via INTRAVENOUS
  Filled 2018-03-31: qty 100

## 2018-03-31 MED ORDER — DIPHENHYDRAMINE HCL 12.5 MG/5ML PO ELIX
12.5000 mg | ORAL_SOLUTION | Freq: Four times a day (QID) | ORAL | Status: DC | PRN
  Filled 2018-03-31: qty 5

## 2018-03-31 MED ORDER — LIDOCAINE 2% (20 MG/ML) 5 ML SYRINGE
INTRAMUSCULAR | Status: DC | PRN
  Administered 2018-03-31: 100 mg via INTRAVENOUS

## 2018-03-31 MED ORDER — SENNOSIDES-DOCUSATE SODIUM 8.6-50 MG PO TABS
1.0000 | ORAL_TABLET | Freq: Every day | ORAL | Status: DC
  Administered 2018-03-31: 1 via ORAL
  Filled 2018-03-31: qty 1

## 2018-03-31 MED ORDER — SODIUM CHLORIDE (PF) 0.9 % IJ SOLN
INTRAMUSCULAR | Status: DC | PRN
  Administered 2018-03-31: 50 mL

## 2018-03-31 MED ORDER — 0.9 % SODIUM CHLORIDE (POUR BTL) OPTIME
TOPICAL | Status: DC | PRN
  Administered 2018-03-31: 1000 mL

## 2018-03-31 MED ORDER — POTASSIUM CHLORIDE 10 MEQ/50ML IV SOLN: 10.0000 meq | Freq: Every day | INTRAVENOUS | Status: DC | PRN

## 2018-03-31 MED ORDER — ACETAMINOPHEN 500 MG PO TABS
1000.0000 mg | ORAL_TABLET | Freq: Four times a day (QID) | ORAL | Status: DC
  Administered 2018-03-31 – 2018-04-03 (×10): 1000 mg via ORAL
  Filled 2018-03-31 (×11): qty 2

## 2018-03-31 MED ORDER — ROCURONIUM BROMIDE 10 MG/ML (PF) SYRINGE: PREFILLED_SYRINGE | INTRAVENOUS | Status: DC | PRN

## 2018-03-31 MED ORDER — PROPOFOL 10 MG/ML IV BOLUS
INTRAVENOUS | Status: AC
  Filled 2018-03-31: qty 20

## 2018-03-31 MED ORDER — FENTANYL CITRATE (PF) 100 MCG/2ML IJ SOLN
INTRAMUSCULAR | Status: AC
  Administered 2018-03-31: 50 ug via INTRAVENOUS
  Filled 2018-03-31: qty 2

## 2018-03-31 MED ORDER — NALOXONE HCL 0.4 MG/ML IJ SOLN: 0.4000 mg | INTRAMUSCULAR | Status: DC | PRN

## 2018-03-31 MED ORDER — EPHEDRINE 5 MG/ML INJ
INTRAVENOUS | Status: AC
  Filled 2018-03-31: qty 10

## 2018-03-31 MED ORDER — ONDANSETRON HCL 4 MG/2ML IJ SOLN
4.0000 mg | Freq: Four times a day (QID) | INTRAMUSCULAR | Status: DC | PRN
  Filled 2018-03-31: qty 2

## 2018-03-31 MED ORDER — ENOXAPARIN SODIUM 40 MG/0.4ML ~~LOC~~ SOLN
40.0000 mg | Freq: Every day | SUBCUTANEOUS | Status: DC
  Administered 2018-03-31 – 2018-04-02 (×3): 40 mg via SUBCUTANEOUS
  Filled 2018-03-31 (×3): qty 0.4

## 2018-03-31 MED ORDER — FENTANYL CITRATE (PF) 100 MCG/2ML IJ SOLN: 25.0000 ug | INTRAMUSCULAR | Status: DC | PRN

## 2018-03-31 MED ORDER — DIPHENHYDRAMINE HCL 50 MG/ML IJ SOLN: 12.5000 mg | Freq: Four times a day (QID) | INTRAMUSCULAR | Status: DC | PRN

## 2018-03-31 MED ORDER — DEXAMETHASONE SODIUM PHOSPHATE 10 MG/ML IJ SOLN
INTRAMUSCULAR | Status: AC
  Filled 2018-03-31: qty 1

## 2018-03-31 MED ORDER — BISACODYL 5 MG PO TBEC
10.0000 mg | DELAYED_RELEASE_TABLET | Freq: Every day | ORAL | Status: DC
  Administered 2018-03-31 – 2018-04-01 (×2): 10 mg via ORAL
  Filled 2018-03-31 (×3): qty 2

## 2018-03-31 MED ORDER — LEVOTHYROXINE SODIUM 25 MCG PO TABS
25.0000 ug | ORAL_TABLET | Freq: Every day | ORAL | Status: DC
  Administered 2018-04-01 – 2018-04-03 (×3): 25 ug via ORAL
  Filled 2018-03-31 (×3): qty 1

## 2018-03-31 MED ORDER — ACETAMINOPHEN 160 MG/5ML PO SOLN
1000.0000 mg | Freq: Four times a day (QID) | ORAL | Status: DC
  Filled 2018-03-31: qty 40.6

## 2018-03-31 MED ORDER — BUPIVACAINE HCL (PF) 0.5 % IJ SOLN
INTRAMUSCULAR | Status: DC | PRN
  Administered 2018-03-31: 30 mL

## 2018-03-31 MED ORDER — SODIUM CHLORIDE 0.9% FLUSH: 9.0000 mL | INTRAVENOUS | Status: DC | PRN

## 2018-03-31 MED ORDER — FENTANYL CITRATE (PF) 100 MCG/2ML IJ SOLN
INTRAMUSCULAR | Status: AC
  Filled 2018-03-31: qty 2

## 2018-03-31 MED ORDER — FENTANYL CITRATE (PF) 250 MCG/5ML IJ SOLN
INTRAMUSCULAR | Status: AC
  Filled 2018-03-31: qty 5

## 2018-03-31 MED ORDER — LIDOCAINE 2% (20 MG/ML) 5 ML SYRINGE
INTRAMUSCULAR | Status: AC
  Filled 2018-03-31: qty 5

## 2018-03-31 MED ORDER — DEXAMETHASONE SODIUM PHOSPHATE 10 MG/ML IJ SOLN
INTRAMUSCULAR | Status: DC | PRN
  Administered 2018-03-31: 10 mg via INTRAVENOUS

## 2018-03-31 MED ORDER — LACTATED RINGERS IV SOLN
INTRAVENOUS | Status: DC | PRN
  Administered 2018-03-31: 13:00:00 via INTRAVENOUS

## 2018-03-31 MED ORDER — PROMETHAZINE HCL 25 MG/ML IJ SOLN: 6.2500 mg | INTRAMUSCULAR | Status: DC | PRN

## 2018-03-31 MED ORDER — FENTANYL CITRATE (PF) 100 MCG/2ML IJ SOLN
INTRAMUSCULAR | Status: AC
  Administered 2018-03-31: 100 ug via INTRAVENOUS
  Filled 2018-03-31: qty 2

## 2018-03-31 MED ORDER — BUPIVACAINE HCL (PF) 0.5 % IJ SOLN
INTRAMUSCULAR | Status: AC
  Filled 2018-03-31: qty 30

## 2018-03-31 MED ORDER — FENTANYL 40 MCG/ML IV SOLN
INTRAVENOUS | Status: DC
  Administered 2018-03-31: 17:00:00 via INTRAVENOUS
  Administered 2018-03-31: 75 ug via INTRAVENOUS
  Administered 2018-04-01: 135 ug via INTRAVENOUS
  Administered 2018-04-01 (×2): 60 ug via INTRAVENOUS
  Administered 2018-04-01: 135 ug via INTRAVENOUS
  Administered 2018-04-01: 60 ug via INTRAVENOUS
  Administered 2018-04-01: 180 ug via INTRAVENOUS
  Administered 2018-04-01: 1000 ug via INTRAVENOUS
  Administered 2018-04-02: 30 ug via INTRAVENOUS
  Filled 2018-03-31: qty 25
  Filled 2018-03-31: qty 1000

## 2018-03-31 MED ORDER — KETOROLAC TROMETHAMINE 15 MG/ML IJ SOLN
15.0000 mg | Freq: Four times a day (QID) | INTRAMUSCULAR | Status: AC | PRN
  Administered 2018-04-01 – 2018-04-02 (×6): 15 mg via INTRAVENOUS
  Filled 2018-03-31 (×6): qty 1

## 2018-03-31 MED ORDER — SUGAMMADEX SODIUM 200 MG/2ML IV SOLN
INTRAVENOUS | Status: DC | PRN
  Administered 2018-03-31: 270 mg via INTRAVENOUS

## 2018-03-31 MED ORDER — FENTANYL CITRATE (PF) 100 MCG/2ML IJ SOLN
100.0000 ug | Freq: Once | INTRAMUSCULAR | Status: AC
  Administered 2018-03-31: 100 ug via INTRAVENOUS

## 2018-03-31 MED ORDER — CEFAZOLIN SODIUM-DEXTROSE 2-4 GM/100ML-% IV SOLN
2.0000 g | Freq: Three times a day (TID) | INTRAVENOUS | Status: AC
  Administered 2018-03-31 – 2018-04-01 (×2): 2 g via INTRAVENOUS
  Filled 2018-03-31 (×2): qty 100

## 2018-03-31 SURGICAL SUPPLY — 95 items
ADH SKN CLS APL DERMABOND .7 (GAUZE/BANDAGES/DRESSINGS) ×2
APPLIER CLIP ROT 10 11.4 M/L (STAPLE)
APR CLP MED LRG 11.4X10 (STAPLE)
BAG SPEC RTRVL LRG 6X4 10 (ENDOMECHANICALS) ×1
CANISTER SUCT 3000ML PPV (MISCELLANEOUS) ×3 IMPLANT
CATH THORACIC 28FR (CATHETERS) IMPLANT
CATH THORACIC 28FR RT ANG (CATHETERS) IMPLANT
CATH THORACIC 36FR (CATHETERS) IMPLANT
CATH THORACIC 36FR RT ANG (CATHETERS) IMPLANT
CLIP APPLIE ROT 10 11.4 M/L (STAPLE) IMPLANT
CLIP VESOCCLUDE MED 6/CT (CLIP) ×1 IMPLANT
CONN ST 1/4X3/8  BEN (MISCELLANEOUS)
CONN ST 1/4X3/8 BEN (MISCELLANEOUS) IMPLANT
CONN Y 3/8X3/8X3/8  BEN (MISCELLANEOUS)
CONN Y 3/8X3/8X3/8 BEN (MISCELLANEOUS) IMPLANT
CONT SPEC 4OZ CLIKSEAL STRL BL (MISCELLANEOUS) ×16 IMPLANT
COVER SURGICAL LIGHT HANDLE (MISCELLANEOUS) IMPLANT
COVER WAND RF STERILE (DRAPES) ×3 IMPLANT
DERMABOND ADVANCED (GAUZE/BANDAGES/DRESSINGS) ×4
DERMABOND ADVANCED .7 DNX12 (GAUZE/BANDAGES/DRESSINGS) IMPLANT
DRAIN CHANNEL 28F RND 3/8 FF (WOUND CARE) IMPLANT
DRAIN CHANNEL 32F RND 10.7 FF (WOUND CARE) IMPLANT
DRAPE LAPAROSCOPIC ABDOMINAL (DRAPES) ×3 IMPLANT
DRAPE SLUSH/WARMER DISC (DRAPES) ×2 IMPLANT
DRAPE WARM FLUID 44X44 (DRAPE) ×1 IMPLANT
ELECT BLADE 6.5 EXT (BLADE) ×3 IMPLANT
ELECT REM PT RETURN 9FT ADLT (ELECTROSURGICAL) ×3
ELECTRODE REM PT RTRN 9FT ADLT (ELECTROSURGICAL) ×1 IMPLANT
GAUZE SPONGE 4X4 12PLY STRL (GAUZE/BANDAGES/DRESSINGS) ×3 IMPLANT
GLOVE BIOGEL PI IND STRL 6.5 (GLOVE) IMPLANT
GLOVE BIOGEL PI INDICATOR 6.5 (GLOVE) ×2
GLOVE SURG SIGNA 7.5 PF LTX (GLOVE) ×6 IMPLANT
GOWN STRL REUS W/ TWL LRG LVL3 (GOWN DISPOSABLE) ×2 IMPLANT
GOWN STRL REUS W/ TWL XL LVL3 (GOWN DISPOSABLE) ×1 IMPLANT
GOWN STRL REUS W/TWL LRG LVL3 (GOWN DISPOSABLE) ×6
GOWN STRL REUS W/TWL XL LVL3 (GOWN DISPOSABLE) ×3
HEMOSTAT SURGICEL 2X14 (HEMOSTASIS) IMPLANT
KIT BASIN OR (CUSTOM PROCEDURE TRAY) ×3 IMPLANT
KIT SUCTION CATH 14FR (SUCTIONS) IMPLANT
KIT TURNOVER KIT B (KITS) ×3 IMPLANT
NDL HYPO 25GX1X1/2 BEV (NEEDLE) ×1 IMPLANT
NDL SPNL 18GX3.5 QUINCKE PK (NEEDLE) IMPLANT
NDL SPNL 22GX3.5 QUINCKE BK (NEEDLE) ×1 IMPLANT
NEEDLE HYPO 25GX1X1/2 BEV (NEEDLE) IMPLANT
NEEDLE SPNL 18GX3.5 QUINCKE PK (NEEDLE) IMPLANT
NEEDLE SPNL 22GX3.5 QUINCKE BK (NEEDLE) ×3 IMPLANT
NS IRRIG 1000ML POUR BTL (IV SOLUTION) ×9 IMPLANT
PACK CHEST (CUSTOM PROCEDURE TRAY) ×3 IMPLANT
PAD ARMBOARD 7.5X6 YLW CONV (MISCELLANEOUS) ×6 IMPLANT
POUCH ENDO CATCH II 15MM (MISCELLANEOUS) ×2 IMPLANT
POUCH SPECIMEN RETRIEVAL 10MM (ENDOMECHANICALS) ×2 IMPLANT
RELOAD STAPLE 35X2.5 WHT THIN (STAPLE) IMPLANT
RELOAD STAPLE 60 3.8 GOLD REG (STAPLE) IMPLANT
RELOAD STAPLE 60 4.1 GRN THCK (STAPLE) IMPLANT
RELOAD STAPLER GOLD 60MM (STAPLE) ×6 IMPLANT
RELOAD STAPLER GREEN 60MM (STAPLE) ×1 IMPLANT
SCISSORS ENDO CVD 5DCS (MISCELLANEOUS) IMPLANT
SEALANT PROGEL (MISCELLANEOUS) IMPLANT
SEALANT SURG COSEAL 4ML (VASCULAR PRODUCTS) IMPLANT
SEALANT SURG COSEAL 8ML (VASCULAR PRODUCTS) IMPLANT
SHEARS HARMONIC HDI 20CM (ELECTROSURGICAL) ×2 IMPLANT
SOLUTION ANTI FOG 6CC (MISCELLANEOUS) ×3 IMPLANT
SPECIMEN JAR MEDIUM (MISCELLANEOUS) IMPLANT
SPONGE INTESTINAL PEANUT (DISPOSABLE) ×4 IMPLANT
SPONGE TONSIL TAPE 1 RFD (DISPOSABLE) ×3 IMPLANT
STAPLE ECHEON FLEX 60 POW ENDO (STAPLE) ×2 IMPLANT
STAPLE RELOAD 2.5MM WHITE (STAPLE) ×24 IMPLANT
STAPLER RELOAD GOLD 60MM (STAPLE) ×18
STAPLER RELOAD GREEN 60MM (STAPLE) ×3
STAPLER VASCULAR ECHELON 35 (CUTTER) ×2 IMPLANT
SUT PROLENE 4 0 RB 1 (SUTURE)
SUT PROLENE 4-0 RB1 .5 CRCL 36 (SUTURE) IMPLANT
SUT SILK  1 MH (SUTURE) ×2
SUT SILK 1 MH (SUTURE) ×2 IMPLANT
SUT SILK 1 TIES 10X30 (SUTURE) ×1 IMPLANT
SUT SILK 2 0 SH (SUTURE) IMPLANT
SUT SILK 2 0SH CR/8 30 (SUTURE) IMPLANT
SUT SILK 3 0 SH 30 (SUTURE) IMPLANT
SUT SILK 3 0SH CR/8 30 (SUTURE) ×1 IMPLANT
SUT VIC AB 1 CTX 36 (SUTURE) ×3
SUT VIC AB 1 CTX36XBRD ANBCTR (SUTURE) ×1 IMPLANT
SUT VIC AB 2-0 CTX 36 (SUTURE) ×5 IMPLANT
SUT VIC AB 3-0 MH 27 (SUTURE) IMPLANT
SUT VIC AB 3-0 X1 27 (SUTURE) ×5 IMPLANT
SUT VICRYL 2 TP 1 (SUTURE) IMPLANT
SYR 10ML LL (SYRINGE) ×1 IMPLANT
SYR 30ML LL (SYRINGE) ×1 IMPLANT
SYSTEM SAHARA CHEST DRAIN ATS (WOUND CARE) ×3 IMPLANT
TAPE CLOTH 4X10 WHT NS (GAUZE/BANDAGES/DRESSINGS) ×3 IMPLANT
TIP APPLICATOR SPRAY EXTEND 16 (VASCULAR PRODUCTS) IMPLANT
TOWEL GREEN STERILE (TOWEL DISPOSABLE) ×3 IMPLANT
TOWEL GREEN STERILE FF (TOWEL DISPOSABLE) ×3 IMPLANT
TRAY FOLEY MTR SLVR 16FR STAT (SET/KITS/TRAYS/PACK) ×3 IMPLANT
TROCAR XCEL BLADELESS 5X75MML (TROCAR) ×3 IMPLANT
WATER STERILE IRR 1000ML POUR (IV SOLUTION) ×3 IMPLANT

## 2018-03-31 NOTE — Anesthesia Procedure Notes (Signed)
Procedure Name: Intubation Date/Time: 03/31/2018 1:12 PM Performed by: Bryson Corona, CRNA Pre-anesthesia Checklist: Patient identified, Emergency Drugs available, Suction available and Patient being monitored Patient Re-evaluated:Patient Re-evaluated prior to induction Oxygen Delivery Method: Circle System Utilized Preoxygenation: Pre-oxygenation with 100% oxygen Induction Type: IV induction Ventilation: Mask ventilation without difficulty Laryngoscope Size: Mac and 3 Grade View: Grade I Tube type: Oral Endobronchial tube: Double lumen EBT, EBT position confirmed by fiberoptic bronchoscope and Left and 37 Fr Number of attempts: 1 Airway Equipment and Method: Stylet and Oral airway Placement Confirmation: ETT inserted through vocal cords under direct vision,  positive ETCO2 and breath sounds checked- equal and bilateral Secured at: 31 cm Tube secured with: Tape Dental Injury: Teeth and Oropharynx as per pre-operative assessment

## 2018-03-31 NOTE — Progress Notes (Signed)
Admission from PACU awake and alert.

## 2018-03-31 NOTE — Transfer of Care (Signed)
Immediate Anesthesia Transfer of Care Note  Patient: Yolanda [name]  Procedure(s) Performed: VIDEO ASSISTED THORACOSCOPY (VATS)/LEFT UPPER LOBECTOMY (Left Chest)  Patient Location: PACU  Anesthesia Type:General  Level of Consciousness: awake, alert  and oriented  Airway & Oxygen Therapy: Patient Spontanous Breathing and Patient connected to face mask oxygen  Post-op Assessment: Report given to RN, Post -op Vital signs reviewed and stable and Patient moving all extremities  Post vital signs: Reviewed and stable  Last Vitals:  Vitals Value Taken Time  BP    Temp    Pulse 96 03/31/2018  4:03 PM  Resp 17 03/31/2018  4:02 PM  SpO2 93 % 03/31/2018  4:03 PM  Vitals shown include unvalidated device data.  Last Pain:  Vitals:   03/31/18 1012  TempSrc:   PainSc: 0-No pain         Complications: No apparent anesthesia complications

## 2018-03-31 NOTE — Interval H&P Note (Signed)
History and Physical Interval Note: PFTs OK  03/31/2018 11:56 AM  Yolanda [name]  has presented today for surgery, with the diagnosis of LUL NODULE  The various methods of treatment have been discussed with the patient and family. After consideration of risks, benefits and other options for treatment, the patient has consented to  Procedure(s): VIDEO ASSISTED THORACOSCOPY (VATS)/LEFT UPPER LOBECTOMY (Left) as a surgical intervention .  The patient's history has been reviewed, patient examined, no change in status, stable for surgery.  I have reviewed the patient's chart and labs.  Questions were answered to the patient's satisfaction.     [name]

## 2018-03-31 NOTE — Anesthesia Procedure Notes (Signed)
Arterial Line Insertion Start/End12/09/2017 11:15 AM, 03/31/2018 11:20 AM Performed by: Catalina Gravel, MD, anesthesiologist  Patient location: Pre-op. Preanesthetic checklist: patient identified, IV checked, site marked, risks and benefits discussed, surgical consent, monitors and equipment checked, pre-op evaluation, timeout performed and anesthesia consent Lidocaine 1% used for infiltration Left, radial was placed Catheter size: 20 Fr Hand hygiene performed  and maximum sterile barriers used   Attempts: 1 Procedure performed using ultrasound guided technique. Ultrasound Notes:anatomy identified, needle tip was noted to be adjacent to the nerve/plexus identified, no ultrasound evidence of intravascular and/or intraneural injection and image(s) printed for medical record Following insertion, dressing applied and Biopatch. Post procedure assessment: normal and unchanged  Patient tolerated the procedure well with no immediate complications. Additional procedure comments: Attempt x2 by CRNA, Attempt x1 by MDA.Marland Kitchen

## 2018-03-31 NOTE — Anesthesia Procedure Notes (Signed)
Central Venous Catheter Insertion Performed by: Catalina Gravel, MD, anesthesiologist Start/End12/09/2017 11:20 AM, 03/31/2018 11:30 AM Patient location: Pre-op. Preanesthetic checklist: patient identified, IV checked, site marked, risks and benefits discussed, surgical consent, monitors and equipment checked, pre-op evaluation, timeout performed and anesthesia consent Position: Trendelenburg Lidocaine 1% used for infiltration and patient sedated Hand hygiene performed , maximum sterile barriers used  and Seldinger technique used Catheter size: 8 Fr Total catheter length 16. Central line was placed.Double lumen Procedure performed using ultrasound guided technique. Ultrasound Notes:anatomy identified, needle tip was noted to be adjacent to the nerve/plexus identified, no ultrasound evidence of intravascular and/or intraneural injection and image(s) printed for medical record Attempts: 1 Following insertion, dressing applied, line sutured and Biopatch. Post procedure assessment: blood return through all ports, free fluid flow and no air  Patient tolerated the procedure well with no immediate complications.

## 2018-03-31 NOTE — Brief Op Note (Addendum)
03/31/2018  3:49 PM  PATIENT:  Yolanda [name]  52 y.o. female  PRE-OPERATIVE DIAGNOSIS:  LUL NODULE, SUSPECT T1N0, IA LUNG CANCER  POST-OPERATIVE DIAGNOSIS: NON-SMALL CELL CARCINOMA, CLINICAL STAGE IA(T1,N0)  PROCEDURE: LEFT VATS WEDGE RESECTION LUL NODULE THORACOSCOPIC LEFT UPPER LOBECTOMY MEDIASTINAL LYMPH NODE DISSECTION INTERCOSTAL NERVE BLOCKS  SURGEON:  Surgeon(s) and Role:    * [name], MD - Primary  PHYSICIAN ASSISTANT:  Nicholes Rough, PA-C  ANESTHESIA:   general  EBL:  150 mL   BLOOD ADMINISTERED:none  DRAINS: ROUTINE   LOCAL MEDICATIONS USED:  BUPIVICAINE   SPECIMEN:  Source of Specimen:  LEFT UPPER LOBE  DISPOSITION OF SPECIMEN:  PATHOLOGY  COUNTS:  YES  DICTATION: .Dragon Dictation  PLAN OF CARE: Admit to inpatient   PATIENT DISPOSITION:  ICU - intubated and hemodynamically stable.   Delay start of Pharmacological VTE agent (>24hrs) due to surgical blood loss or risk of bleeding: no  FINDINGS: Frozen showed non-small cell carcinoma, bronchial margin negative

## 2018-03-31 NOTE — Anesthesia Preprocedure Evaluation (Signed)
Anesthesia Evaluation  Patient identified by MRN, date of birth, ID band Patient awake    Reviewed: Allergy & Precautions, NPO status , Patient's Chart, lab work & pertinent test results  Airway Mallampati: II  TM Distance: >3 FB Neck ROM: Full    Dental  (+) Teeth Intact, Dental Advisory Given   Pulmonary COPD,  COPD inhaler, former smoker,    Pulmonary exam normal breath sounds clear to auscultation       Cardiovascular (-) hypertension(-) angina(-) CAD and (-) Past MI Normal cardiovascular exam+ Valvular Problems/Murmurs MVP  Rhythm:Regular Rate:Normal     Neuro/Psych Seizures - (3 years ago), Well Controlled,  PSYCHIATRIC DISORDERS Anxiety Depression    GI/Hepatic negative GI ROS, Neg liver ROS,   Endo/Other  negative endocrine ROSHypothyroidism   Renal/GU negative Renal ROS     Musculoskeletal negative musculoskeletal ROS (+)   Abdominal   Peds  Hematology negative hematology ROS (+)   Anesthesia Other Findings Day of surgery medications reviewed with the patient.  Reproductive/Obstetrics                             Anesthesia Physical Anesthesia Plan  ASA: III  Anesthesia Plan: General   Post-op Pain Management:    Induction: Intravenous  PONV Risk Score and Plan: 3 and Midazolam, Dexamethasone and Ondansetron  Airway Management Planned: Double Lumen EBT  Additional Equipment:   Intra-op Plan:   Post-operative Plan: Extubation in OR  Informed Consent: I have reviewed the patients History and Physical, chart, labs and discussed the procedure including the risks, benefits and alternatives for the proposed anesthesia with the patient or authorized representative who has indicated his/her understanding and acceptance.   Dental advisory given  Plan Discussed with: CRNA  Anesthesia Plan Comments:         Anesthesia Quick Evaluation

## 2018-03-31 NOTE — Plan of Care (Signed)

## 2018-03-31 NOTE — Op Note (Signed)
NAME: Yolanda [name], Yolanda [name] MEDICAL RECORD [id number] ACCOUNT [id number] DATE OF BIRTH:01-16-1966 FACILITY: [facility] LOCATION: [facility] PHYSICIAN:STEVEN C. HENDRICKSON, MD  OPERATIVE REPORT  DATE OF PROCEDURE:  03/31/2018  PREOPERATIVE DIAGNOSIS:  Left upper lobe nodule, suspected non-small cell carcinoma.  POSTOPERATIVE DIAGNOSIS:  Non-small cell carcinoma, clinical stage IA (T1, N0).  PROCEDURE:   Left VATS, Wedge resection of left upper lobe nodule, Thoracoscopic left upper lobectomy, Mediastinal lymph node dissection, Intercostal nerve blocks at levels 3 through 9.  SURGEON:  Modesto Charon, MD  ASSISTANT:  Nicholes Rough, PA  ANESTHESIA:  General.  FINDINGS:  Frozen section of left upper lobe nodule showed non-small cell carcinoma.  Bronchial margin negative for tumor.  CLINICAL NOTE:  The patient is a 52 year old woman with a history of tobacco abuse prior to quitting about 2 months ago.  She presented in October with cough, shortness of breath and fever.  Chest x-ray showed a left upper lobe opacity.  She was given  antibiotics, but the symptoms did not improve.  A CT of the chest was ultimately done which showed a 2.4 x 2.1 cm left upper lobe spiculated nodule suspicious for a primary bronchogenic carcinoma.  She was referred to Dr. Federico Flake.  A PET CT was done  which showed the nodule was hypermetabolic.  There was no evidence of regional or distant metastases.  She was referred for consideration for surgical resection.  Indications, risks, benefits, and alternatives were discussed in detail with the patient.   She understood and accepted the risks and agreed to proceed.  OPERATIVE NOTE:  The patient was brought to the preoperative holding area on 03/31/2018.  Anesthesia placed a central line and an arterial blood pressure monitor line.  She was taken to the operating room, anesthetized and intubated with a double lumen  endotracheal tube.  Intravenous antibiotics were  administered.  A Foley catheter was placed.  Sequential compression devices were placed on the calves for DVT prophylaxis.  She was placed in a right lateral decubitus position and the left chest was  prepped and draped in the usual sterile fashion.  Single lung ventilation of the right lung was initiated.  This was tolerated well throughout the procedure, although there was reversal of the tube.  A timeout was performed.  A solution containing 20 mL of liposomal bupivacaine, 30 mL of 0.5% bupivacaine and 50 mL of saline was prepared.  This was used for local at the incision sites as well as the nerve blocks.  A site at the mid axillary line in  the 7th interspace was injected with the bupivacaine solution.  A small port type incision was made and a 5 mm port was inserted into the chest.  The thoracoscope was advanced into the chest.  There was obvious blood within the chest.  There was a small  puncture of the lower lobe.  There was air coming from this.  Dr. Gifford Shave of anesthesia examined the tube and ultimately determined that the tube was in the right main stem rather than the  left main stem.  Once the left lung was isolated and actual right  lung ventilation was initiated, there was good exposure and the patient tolerated it well throughout the procedure.  An area in the fourth interspace anterolaterally, then was injected with the bupivacaine solution and a 5 cm working incision was made.  No rib spreading was performed during the procedure.  A second port incision was made anterior to the first.   Examination of the  chest revealed a relatively complete fissure.  There was no pleural effusion.  There was no abnormality of the visceral or parietal pleura.  The nodule was palpable in the posterolateral aspect of the left upper lobe.  A wedge  resection was performed with sequential firings of an Echelon stapler.  A 60 mm stapler with gold cartridges was used.  The specimen was placed into an endoscopic  retrieval bag, removed and sent for frozen section.  While awaiting those results, the  intercostal nerve blocks were performed at levels 3 through 9.  This was done from a posterior approach with the bupivacaine solution injected into a subpleural plane, 8 mL was injected into each interspace.  The frozen section returned showing non-small  cell carcinoma.  The inferior ligament was divided with a Harmonic scalpel.  All lymph nodes that were encountered during the dissection were removed and sent for permanent pathology.  A level 9 node was identified.  It was normal-appearing.  It was  sent as a specimen.  The pleural reflection was divided at the hilum posteriorly and multiple level 7 nodes were encountered.  These were all sent separately.  A level 4L node also was noted and sent as a separate specimen.  The pleural reflection was  divided at the hilum anteriorly.  The lingular vein branch was dissected out, encircled and divided with the vascular stapler.  The remaining more superior lingular branch as well as the remainder of the superior pulmonary vein were encircled and divided  with the vascular stapler as well.  There was a large level 10 node between the bronchus and the pulmonary artery.  This was dissected out.  Moving back to the fissure, a level 11 node was also large, but otherwise benign appearing.  It was removed.   This allowed the lingular arterial branches to be encircled and divided with the endoscopic vascular stapler.  As the dissection was carried along the main pulmonary artery in the fissure, the posterior branch was identified.  This was easily divided  with the vascular stapler.  The apical and anterior branches arose from a large common trunk.  This was dissected out from both sides and then the stapler was placed from an anterior approach and fired, dividing the vessel.  The bronchus now was the only  thing remaining.  An Echelon stapler with a green cartridge was placed across  the left upper lobe bronchus close to its origin and closed.  A test inflation showed good aeration of the lower lobe.  The stapler was fired, transecting the bronchus.  The  left upper lobe was placed into an endoscopic retrieval bag, removed and sent for frozen section of the bronchial margin, which subsequently returned with no tumor seen.  The chest was copiously irrigated with warm saline.  A test inflation to 30 cm of water revealed no leakage from the bronchial stump.  The small puncture in the lower lobe was stapled using the Echelon stapler with a gold cartridge.  A 28 French chest  tube was placed through the anterior most port incision and secured with a #1 silk suture.  Dual-lung ventilation was resumed.  The posterior port incision was closed with a 3-0 Vicryl subcuticular suture.  The working incision was closed with a #1  Vicryl fascial suture, 2-0 Vicryl subcutaneous suture and 3-0 Vicryl subcuticular suture.  The chest tube was placed to suction.  The patient was placed back in supine position.  She was extubated in the operating  room and taken to the Postanesthetic  Care Unit in good condition.  TN/NUANCE  D:03/31/2018 T:03/31/2018 JOB:[id number]

## 2018-04-01 ENCOUNTER — Inpatient Hospital Stay (HOSPITAL_COMMUNITY): Payer: BLUE CROSS/BLUE SHIELD

## 2018-04-01 LAB — BASIC METABOLIC PANEL
Anion gap: 8 (ref 5–15)
BUN: 9 mg/dL (ref 6–20)
CO2: 23 mmol/L (ref 22–32)
Calcium: 8.4 mg/dL — ABNORMAL LOW (ref 8.9–10.3)
Chloride: 108 mmol/L (ref 98–111)
Creatinine, Ser: 0.69 mg/dL (ref 0.44–1.00)
GFR calc Af Amer: >60 mL/min (ref >60)
GFR calc non Af Amer: >60 mL/min (ref >60)
Glucose, Bld: 147 mg/dL — ABNORMAL HIGH (ref 70–99)
Potassium: 4.1 mmol/L (ref 3.5–5.1)
Sodium: 139 mmol/L (ref 135–145)

## 2018-04-01 LAB — BLOOD GAS, ARTERIAL
Acid-base deficit: 0.9 mmol/L (ref 0.0–2.0)
Bicarbonate: 23.7 mmol/L (ref 20.0–28.0)
O2 Content: 2 L/min
O2 Saturation: 98.2 %
Patient temperature: 98.6
pCO2 arterial: 42.2 mmHg (ref 32.0–48.0)
pH, Arterial: 7.368 (ref 7.350–7.450)
pO2, Arterial: 102 mmHg (ref 83.0–108.0)

## 2018-04-01 LAB — CBC
HCT: 34.7 % — ABNORMAL LOW (ref 36.0–46.0)
Hemoglobin: 11.2 g/dL — ABNORMAL LOW (ref 12.0–15.0)
MCH: 30.8 pg (ref 26.0–34.0)
MCHC: 32.3 g/dL (ref 30.0–36.0)
MCV: 95.3 fL (ref 80.0–100.0)
Platelets: 259 10*3/uL (ref 150–400)
RBC: 3.64 MIL/uL — ABNORMAL LOW (ref 3.87–5.11)
RDW: 12.1 % (ref 11.5–15.5)
WBC: 9.3 10*3/uL (ref 4.0–10.5)
nRBC: 0 % (ref 0.0–0.2)

## 2018-04-01 LAB — GLUCOSE, CAPILLARY
Glucose-Capillary: 114 mg/dL — ABNORMAL HIGH (ref 70–99)
Glucose-Capillary: 119 mg/dL — ABNORMAL HIGH (ref 70–99)
Glucose-Capillary: 197 mg/dL — ABNORMAL HIGH (ref 70–99)

## 2018-04-01 NOTE — Plan of Care (Signed)
  Problem: Clinical Measurements: Goal: Diagnostic test results will improve Outcome: Progressing   Problem: Pain Managment: Goal: General experience of comfort will improve Outcome: Progressing   Problem: Education: Goal: Knowledge of disease or condition will improve Outcome: Progressing Goal: Knowledge of the prescribed therapeutic regimen will improve Outcome: Progressing   Problem: Pain Management: Goal: Pain level will decrease Outcome: Progressing   Problem: Education: Goal: Knowledge of General Education information will improve Description Including pain rating scale, medication(s)/side effects and non-pharmacologic comfort measures Outcome: Adequate for Discharge   Problem: Health Behavior/Discharge Planning: Goal: Ability to manage health-related needs will improve Outcome: Adequate for Discharge   Problem: Clinical Measurements: Goal: Ability to maintain clinical measurements within normal limits will improve Outcome: Adequate for Discharge Goal: Will remain free from infection Outcome: Adequate for Discharge Goal: Respiratory complications will improve Outcome: Adequate for Discharge   Problem: Activity: Goal: Risk for activity intolerance will decrease Outcome: Adequate for Discharge   Problem: Elimination: Goal: Will not experience complications related to bowel motility Outcome: Adequate for Discharge Goal: Will not experience complications related to urinary retention Outcome: Adequate for Discharge   Problem: Skin Integrity: Goal: Risk for impaired skin integrity will decrease Outcome: Adequate for Discharge   Problem: Activity: Goal: Risk for activity intolerance will decrease Outcome: Adequate for Discharge   Problem: Cardiac: Goal: Will achieve and/or maintain hemodynamic stability Outcome: Adequate for Discharge   Problem: Clinical Measurements: Goal: Postoperative complications will be avoided or minimized Outcome: Adequate for  Discharge   Problem: Respiratory: Goal: Respiratory status will improve Outcome: Adequate for Discharge   Problem: Skin Integrity: Goal: Wound healing without signs and symptoms infection will improve Outcome: Adequate for Discharge

## 2018-04-01 NOTE — Plan of Care (Signed)
  Problem: Education: Goal: Knowledge of General Education information will improve Description Including pain rating scale, medication(s)/side effects and non-pharmacologic comfort measures Outcome: Progressing   Problem: Health Behavior/Discharge Planning: Goal: Ability to manage health-related needs will improve Outcome: Progressing   Problem: Clinical Measurements: Goal: Ability to maintain clinical measurements within normal limits will improve Outcome: Progressing Goal: Will remain free from infection Outcome: Progressing Goal: Diagnostic test results will improve Outcome: Progressing Goal: Respiratory complications will improve Outcome: Progressing   Problem: Activity: Goal: Risk for activity intolerance will decrease Outcome: Progressing   Problem: Elimination: Goal: Will not experience complications related to bowel motility Outcome: Progressing   Problem: Pain Managment: Goal: General experience of comfort will improve Outcome: Progressing   Problem: Skin Integrity: Goal: Risk for impaired skin integrity will decrease Outcome: Progressing

## 2018-04-01 NOTE — Progress Notes (Signed)
3 ml of fentanyl wasted in stericycle with Neldon [place], RN.

## 2018-04-01 NOTE — Progress Notes (Addendum)
      [address]       [place] [address]             [phone]       1 Day Post-Op Procedure(s) (LRB): VIDEO ASSISTED THORACOSCOPY (VATS)/LEFT UPPER LOBECTOMY (Left)  Subjective: Patient with incisional pain.  Objective: Vital signs in last 24 hours: Temp:  [97.6 F (36.4 C)-98.7 F (37.1 C)] 98.2 F (36.8 C) (12/07 0733) Pulse Rate:  [74-100] 81 (12/07 0346) Cardiac Rhythm: Normal sinus rhythm (12/07 0800) Resp:  [10-23] 17 (12/07 0800) BP: (104-144)/(59-85) 123/74 (12/07 0733) SpO2:  [96 %-100 %] 99 % (12/07 0800) Arterial Line BP: (116-152)/(57-69) 116/57 (12/06 1737) Weight:  [66.1 kg] 66.1 kg (12/06 1815)     Intake/Output from previous day: 12/06 0701 - 12/07 0700 In: 2637.2 [P.O.:150; I.V.:2137.2; IV Piggyback:200] Out: 2195 [Urine:1925; Blood:150; Chest Tube:120]   Physical Exam:  Cardiovascular: RRR Pulmonary: Clear to auscultation on the right and coarse on the left Abdomen: Soft, non tender, bowel sounds present. Extremities: Trace bilateral lower extremity edema. Wounds: Clean and dry.  No erythema or signs of infection. Chest tube dressing with some bloody ooze Chest Tube: to suction, initially +1 air leak with cough but then tidling only  Lab Results: CBC: Recent Labs    03/29/18 1206 04/01/18 0354  WBC 4.3 9.3  HGB 12.5 11.2*  HCT 38.8 34.7*  PLT 272 259   BMET:  Recent Labs    03/29/18 1206 04/01/18 0354  NA 142 139  K 3.7 4.1  CL 106 108  CO2 25 23  GLUCOSE 107* 147*  BUN 12 9  CREATININE 0.65 0.69  CALCIUM 9.3 8.4*    PT/INR:  Recent Labs    03/29/18 1206  LABPROT 12.5  INR 0.94   ABG:  INR: Will add last result for INR, ABG once components are confirmed Will add last 4 CBG results once components are confirmed  Assessment/Plan:  1. CV - SR in the 80's. 2.  Pulmonary - On 2 liters of oxygen via [place]. Wean over next few days. Chest tubes with 120 cc since surgery. Chest tube to suction. There  was initially a + 1 air leak with cough but then tidling only.  CXR this am appears stable. Encourage incentive spirometer. Dr. Roxan Hockey will determine if place to water seal. Await final pathology  3. Remove a line and foley,decrease IVF 4. CBGs 197/119/114. No history of DM so will stop accu checks and SS PRN 5. ABL anemia-H and h 11.2 and 34.7 this am  Yolanda M ZimmermanPA-C 04/01/2018,9:30 AM [phone]  Patient seen and examined, agree with above No air leak presently- CT to water seal SCD + enoxaparin + ambulation for DVT prophylaxis  Remo Lipps C. Roxan Hockey, MD [facility] [phone]

## 2018-04-02 ENCOUNTER — Inpatient Hospital Stay (HOSPITAL_COMMUNITY): Payer: BLUE CROSS/BLUE SHIELD

## 2018-04-02 LAB — COMPREHENSIVE METABOLIC PANEL
ALT: 18 U/L (ref 0–44)
AST: 19 U/L (ref 15–41)
Albumin: 2.9 g/dL — ABNORMAL LOW (ref 3.5–5.0)
Alkaline Phosphatase: 67 U/L (ref 38–126)
Anion gap: 5 (ref 5–15)
BUN: 7 mg/dL (ref 6–20)
CO2: 28 mmol/L (ref 22–32)
Calcium: 8.4 mg/dL — ABNORMAL LOW (ref 8.9–10.3)
Chloride: 108 mmol/L (ref 98–111)
Creatinine, Ser: 0.53 mg/dL (ref 0.44–1.00)
GFR calc Af Amer: >60 mL/min (ref >60)
GFR calc non Af Amer: >60 mL/min (ref >60)
Glucose, Bld: 103 mg/dL — ABNORMAL HIGH (ref 70–99)
Potassium: 3.9 mmol/L (ref 3.5–5.1)
Sodium: 141 mmol/L (ref 135–145)
Total Bilirubin: 0.6 mg/dL (ref 0.3–1.2)
Total Protein: 5.4 g/dL — ABNORMAL LOW (ref 6.5–8.1)

## 2018-04-02 LAB — CBC
HCT: 33.3 % — ABNORMAL LOW (ref 36.0–46.0)
Hemoglobin: 10.5 g/dL — ABNORMAL LOW (ref 12.0–15.0)
MCH: 30.7 pg (ref 26.0–34.0)
MCHC: 31.5 g/dL (ref 30.0–36.0)
MCV: 97.4 fL (ref 80.0–100.0)
Platelets: 226 10*3/uL (ref 150–400)
RBC: 3.42 MIL/uL — ABNORMAL LOW (ref 3.87–5.11)
RDW: 12.4 % (ref 11.5–15.5)
WBC: 9.3 10*3/uL (ref 4.0–10.5)
nRBC: 0 % (ref 0.0–0.2)

## 2018-04-02 MED ORDER — POTASSIUM CHLORIDE CRYS ER 20 MEQ PO TBCR
20.0000 meq | EXTENDED_RELEASE_TABLET | Freq: Once | ORAL | Status: AC
  Administered 2018-04-02: 20 meq via ORAL
  Filled 2018-04-02: qty 1

## 2018-04-02 NOTE — Progress Notes (Addendum)
      [address]       [place] [address]             [phone]       2 Days Post-Op Procedure(s) (LRB): VIDEO ASSISTED THORACOSCOPY (VATS)/LEFT UPPER LOBECTOMY (Left)  Subjective: Patient had bowel movement yesterday.   Objective: Vital signs in last 24 hours: Temp:  [98.1 F (36.7 C)-98.5 F (36.9 C)] 98.1 F (36.7 C) (12/08 0400) Pulse Rate:  [93] 93 (12/07 2346) Cardiac Rhythm: Normal sinus rhythm (12/08 0732) Resp:  [10-25] 14 (12/08 0400) BP: (103-149)/(68-90) 113/68 (12/08 0400) SpO2:  [92 %-99 %] 93 % (12/08 0400)     Intake/Output from previous day: 12/07 0701 - 12/08 0700 In: 1375.8 [P.O.:960; I.V.:415.8] Out: 770 [Urine:400; Chest Tube:370]   Physical Exam:  Cardiovascular: RRR Pulmonary: Clear to auscultation on the right and slightly diminished on the left Abdomen: Soft, non tender, bowel sounds present. Extremities: Trace bilateral lower extremity edema. Wounds: Clean and dry.  No erythema or signs of infection.  Chest Tube: to water seal, no air leak-only tidling with cough  Lab Results: CBC: Recent Labs    04/01/18 0354 04/02/18 0319  WBC 9.3 9.3  HGB 11.2* 10.5*  HCT 34.7* 33.3*  PLT 259 226   BMET:  Recent Labs    04/01/18 0354 04/02/18 0319  NA 139 141  K 4.1 3.9  CL 108 108  CO2 23 28  GLUCOSE 147* 103*  BUN 9 7  CREATININE 0.69 0.53  CALCIUM 8.4* 8.4*    PT/INR:  No results for input(s): LABPROT, INR in the last 72 hours. ABG:  INR: Will add last result for INR, ABG once components are confirmed Will add last 4 CBG results once components are confirmed  Assessment/Plan:  1. CV - SR in the 80's. 2.  Pulmonary - On 2 liters of oxygen via [place]. Wean over next few days. Chest tubes with 370? cc since surgery. Chest tube to water seal. There is no air leak-only tidling with cough. CXR this am appears stable (no pneumothorax). As discussed with Dr. Roxan Hockey, will remove chest tube. Encourage incentive  spirometer. Await final pathology  3. ABL anemia-H and H decreased to 10.5 and 33.3 this am 4. Supplement potassium 5. Will remove PCA and central line today 6. Possible discharge in 1-2 days  Donielle M ZimmermanPA-C 04/02/2018,8:49 AM [phone]  Patient seen and examined, agree with above Dc chest tube, central line  Kinley Dozier C. Roxan Hockey, MD [facility] [phone]

## 2018-04-02 NOTE — Progress Notes (Signed)
Fentanyl 20 mls discarded, witnessed with [facility] RN.

## 2018-04-02 NOTE — Discharge Summary (Addendum)
Physician Discharge Summary       [address]       [place] [address]             [phone]    Patient ID: Yolanda [name] MRN: [id number] DOB/AGE: Sep 15, 1965 52 y.o.  Admit date: 03/31/2018 Discharge date: 04/03/2018  Admission Diagnoses: Left upper lobe nodule (suspected non-small cell carcinoma).  Discharge Diagnoses:  S/P left VATS, wedge resection, left upper lobe nodule, thoracoscopic left upper lobectomy, mediastinal lymph node dissection, intercostal nerve blocks at levels 3 through 9. Non-small cell carcinoma- Stage IB (T2N0)   Procedure (s):  Left VATS, wedge resection, left upper lobe nodule, thoracoscopic left upper lobectomy, mediastinal lymph node dissection, intercostal nerve blocks at levels 3 through 9 by Dr. Roxan Hockey on 03/31/2018.  Pathology:  FINAL DIAGNOSIS Diagnosis 1. Endocervix, curettage - TRANSFORMATION ZONE MUCOSA AND ENDOCERVICAL MUCOSA WITH ASSOCIATED CHRONIC INFLAMMATION, NO DYSPLASIA OR MALIGNANCY. 2. Cervix, biopsy, 12 and 6 o'clock - SLIGHTLY INFLAMED SQUAMOUS MUCOSA WITH CHRONIC CERVICITIS. NO DYSPLASIA OR MALIGNANCY. Microscopic Comment 1. - 2. There is no evidence of dysplasia or malignancy. Immunostains for p16 and Ki-67 confirm the above interpretation. Aldona Bar MD Pathologist, Electronic Signature (Case signed 01/07/2012) Specimen Gross and Clinical Information Specimen(s) Obtained: 1. Endocervix, curettage 2. Cervix, biopsy, 12 and 6 o'clock Specimen Clinical Information 1. Leep 2004; PAP 2007 ASC-US high risk HPV; 2012 LSIL with mild dysplasia; 2013 colpo ASC-US (to) Gross 1. Received in formalin is a 1.3 x 0.8 x 0.3 cm portion of blood tinged mucoid material mixed with soft pink tan tissue fragments. The specimen is entirely submitted in one block. 2. Received in formalin are two semi firm, irregularly shaped white tan tissue fragments, each 0.7 cm in greatest diameter. The specimen is  entirely submitted in one block. (TB:eps 01/05/12) 1 of 2 FINAL for [name], Yolanda [name] [phone]) Stain(s) used in Diagnosis: The following stain(s) were used in diagnosing the case: P16, KI-67-NO ACIS. The control(s) stained appropriately. Disclaimer Some of these immunohistochemical stains may have been developed and the performance characteristics determined by [facility]. Some may not have been cleared or approved by the U.S. Food and Drug Administration. The FDA has determined that such clearance or approval is not necessary. This test is used for clinical purposes. It should not be regarded as investigational or for research. This laboratory is certified under the [place] (CLIA-88) as qualified to perform high complexity clinical laboratory testing. Report signed out from the following location(s) [address], [place], [place] [address]. CLIA #: [id number],   History of Presenting Illness: Yolanda [name] is a 52 yo smoker with a past history of tobacco abuse (1 ppd x 35 years, quit 02/02/2018), COPD, seizures, anxiety, depression, cervical cancer, chronic back pain, and thyroid disease. She was in her usual state of health until 02/02/2018 when she presented to the ED in [place] with productive cough, shortness of breath, fever, sore throat and myalgias. A CXR showed a left upper lobe opacity. She was treated with antibiotics but had no improvement in her symptoms. She was admitted a week later after symptoms failed to improve. Her CXR was unchanged. A CT chest showed a 2.4 x 2.1 cm left upper lobe nodule. There was no adenopathy. She saw Dr. Federico Flake. A PET CT showed the nodule was hypermetabolic. There was a hypermetabolic left thyroid nodule. There was no evidence of regional or distant metastasis.  She has been  very anxious about the diagnosis and wants surgery as soon as possible. She is still  having some shortness of breath and also complaining of chest pain. The CP comes on randomly and last seconds to minutes. Not associated with exertion or stress. Sharp and left sided.  She is back at work.  Dr. Roxan Hockey recommended we proceed with Left VATS for left upper lobectomy. Dr. Roxan Hockey discussed the general nature of the procedure, the need for general anesthesia, the incisions to be used, and the use of a drainage tube postoperatively with [name] and her sister. They discussed the expected hospital stay, overall recovery and short and long term outcomes. Dr. Roxan Hockey informed her of the indications, risks, benefits and alternatives. She understands and agrees to proceed with surgery. She underwent a eft VATS, wedge resection, left upper lobe nodule, thoracoscopic left upper lobectomy, mediastinal lymph node dissection, intercostal nerve blocks at levels 3 through 9 on 03/31/2018.  Brief Hospital Course:    On 03/31/2018 [name] underwent a left VATS, wedge resection, left upper lobectomy with Dr. Roxan Hockey.  She tolerated procedure well and was transferred to the stepdown unit.  Postop day 1 she did have some incisional pain.  She was on 2 L nasal cannula with good oxygenation.  We switched her chest tube to waterseal since there was no air leak and the chest x-ray was stable.  Removed her arterial line and Foley.  We decreased her IV fluids.  She did have some expected acute blood loss anemia.  We initiated subcu heparin and SCDs for DVT prophylaxis.  Postop day 2 she remained in normal sinus rhythm in the 80s.  She still is continuing to require oxygen support although we were weaning as tolerated.  We discontinued her chest tube and central line.  We discontinued her PCA since her pain was well-controlled on oral medication.  Today, her follow-up x-ray was stable.  She was tolerating p.o. pain medication and her pain was well controlled.  She was ambulating with limited  assistance, she was tolerating room air with good oxygenation, her incisions were healing well, and she was ready for discharge home.   Latest Vital Signs: Blood pressure 106/70, pulse 92, temperature 98.1 F (36.7 C), temperature source Oral, resp. rate 16, height 5' 7"  (1.702 m), weight 66.1 kg, last menstrual period 03/30/2007, SpO2 98 %.  Physical Exam:  General appearance: alert, cooperative and no distress Neurologic: intact Heart: regular rate and rhythm Lungs: diminished breath sounds left base Wound: clean and dry  Discharge Condition: Stable and discharged to home.  Recent laboratory studies:  Lab Results  Component Value Date   WBC 9.3 04/02/2018   HGB 10.5 (L) 04/02/2018   HCT 33.3 (L) 04/02/2018   MCV 97.4 04/02/2018   PLT 226 04/02/2018   Lab Results  Component Value Date   NA 141 04/02/2018   K 3.9 04/02/2018   CL 108 04/02/2018   CO2 28 04/02/2018   CREATININE 0.53 04/02/2018   GLUCOSE 103 (H) 04/02/2018      Diagnostic Studies: Dg Chest 2 View  Result Date: 04/03/2018 CLINICAL DATA:  Recent pneumothorax EXAM: CHEST - 2 VIEW COMPARISON:  April 02, 2018 FINDINGS: Rather minimal pneumothorax in the left apical region persists with nearby soft tissue air. No tension component. No new pneumothorax. There is atelectatic change in lung bases. There is no frank edema or consolidation. Heart size and pulmonary vascularity are normal. No adenopathy. No bone lesions. IMPRESSION: Persistent rather minimal pneumothorax on  the left with adjacent soft tissue air. No tension component. Bibasilar atelectatic change. No consolidation. Stable cardiac silhouette. Electronically Signed   By: Lowella Grip III M.D.   On: 04/03/2018 09:06   Dg Chest 2 View  Result Date: 04/02/2018 CLINICAL DATA:  Follow-up pneumothorax.  Left chest tube removal. EXAM: CHEST - 2 VIEW COMPARISON:  04/02/2018 FINDINGS: The heart is normal in size. Stable tortuosity of the thoracic aorta. The  left-sided chest tube is been removed. There is a small, less than 5% residual left apical pneumothorax. Small amount of residual subcutaneous emphysema. Persistent streaky left basilar atelectasis. No definite pleural effusion. The right lung remains clear. IMPRESSION: Very small residual left apical pneumothorax following chest tube removal. Electronically Signed   By: Marijo Sanes M.D.   On: 04/02/2018 17:45   Dg Chest 2 View  Result Date: 03/30/2018 CLINICAL DATA:  Lung nodule.  Planned surgery. EXAM: CHEST - 2 VIEW COMPARISON:  03/11/2018, 02/09/2018.  CT 02/10/2018. FINDINGS: Mediastinum hilar structures normal. Left upper lung mass again noted. No pleural effusion or pneumothorax. Heart size normal. No acute bony abnormality. IMPRESSION: 1.  Left upper lung mass again noted.  No interim change. 2.  No new abnormality identified. Electronically Signed   By: Marcello Moores  Register   On: 03/30/2018 06:32   Nm Pet Image Initial (pi) Skull Base To Thigh  Result Date: 03/07/2018 CLINICAL DATA:  Initial treatment strategy for right upper lobe pulmonary nodule. EXAM: NUCLEAR MEDICINE PET SKULL BASE TO THIGH TECHNIQUE: 9.6 mCi F-18 FDG was injected intravenously. Full-ring PET imaging was performed from the skull base to thigh after the radiotracer. CT data was obtained and used for attenuation correction and anatomic localization. Fasting blood glucose: 99 mg/dl COMPARISON:  02/10/2018 FINDINGS: Mediastinal blood pool activity: SUV max 2.1 NECK: Hypermetabolic brown fat activity noted in the neck bilaterally. Incidental CT findings: none CHEST: The left upper lobe pulmonary nodule measures 2.8 by 2.1 cm on image 87/3 and has a maximum standard uptake value of 12.1, compatible with malignancy. The peripheral 4 mm nodule shown on prior chest CT is not measurably hypermetabolic today, but is below sensitive PET-CT size thresholds. Numerous foci of hypermetabolic brown fat are scattered in the upper chest, paraspinal  region, and mediastinum. No appreciable pathologically enlarged/hypermetabolic adenopathy. Incidental CT findings: none ABDOMEN/PELVIS: Anal activity observed without obvious CT correlate, probably physiologic but technically nonspecific. Incidental CT findings: Punctate 2 mm left kidney lower pole nonobstructive calculus. Probable similar punctate calculus in the right kidney lower pole. Aortoiliac atherosclerotic vascular disease. SKELETON: No significant abnormal hypermetabolic activity in this region. Incidental CT findings: none IMPRESSION: 1. The left upper lobe pulmonary nodule has a maximum SUV of 12.1, compatible with malignancy. No pathologically enlarged/hypermetabolic adenopathy identified. 2. Considerable amount of hypermetabolic brown fat in the neck and chest, benign. 3. Anal activity observed without obvious CT correlate, probably physiologic but technically nonspecific. 4. Suspected nonobstructive nephrolithiasis. 5.  Aortic Atherosclerosis (ICD10-I70.0). Electronically Signed   By: Van Clines M.D.   On: 03/07/2018 09:05   Dg Chest Port 1 View  Result Date: 04/02/2018 CLINICAL DATA:  Followup of pneumothorax EXAM: PORTABLE CHEST 1 VIEW COMPARISON:  04/01/2018 FINDINGS: No change in position of left-sided chest tube. Right internal jugular line tip at low SVC. Midline trachea. Normal heart size. Volume loss in the left hemithorax. Minimal subcutaneous air about the left chest wall. No pleural fluid. There may be trace medial left pleural air along the left heart border. Clear right lung.  IMPRESSION: Left chest tube remaining in place. Suspect trace medial left pleural air. Electronically Signed   By: Abigail Miyamoto M.D.   On: 04/02/2018 09:28   Dg Chest Port 1 View  Result Date: 04/01/2018 CLINICAL DATA:  52 year old female status post thoracoscopic left upper lobectomy EXAM: PORTABLE CHEST 1 VIEW COMPARISON:  Prior chest x-ray 03/31/2018 FINDINGS: Left-sided thoracostomy tube remains  in good position. No evidence of pneumothorax or large effusion. Right IJ central venous catheter in good position with the tip overlying the distal SVC. Surgical changes of left upper lobectomy with volume loss in the left hemithorax. The cardiac and mediastinal contours are within normal limits. The right lung is clear. No acute osseous abnormality. IMPRESSION: 1. Left chest tube in good position without evidence of pneumothorax or pleural effusion. 2. The tip of the right IJ central venous catheter overlies the distal SVC. 3. Improved bibasilar atelectasis. Electronically Signed   By: Jacqulynn Cadet M.D.   On: 04/01/2018 09:31   Dg Chest Port 1 View  Result Date: 03/31/2018 CLINICAL DATA:  Status post left lung lobectomy. EXAM: PORTABLE CHEST 1 VIEW COMPARISON:  Radiographs of March 29, 2018. FINDINGS: Mild mediastinal shift to the left is noted consistent with history of left upper lobectomy. Left upper lobe mass noted on prior exam is no longer visualized. Left-sided chest tube is noted without definite pneumothorax. No significant pleural effusion is noted. Mild right basilar subsegmental atelectasis is noted. Right internal jugular catheter is noted with tip in expected position of the SVC. Bony thorax is unremarkable. IMPRESSION: Status post left upper lobectomy. Left-sided chest tube is noted without pneumothorax. Mild right basilar subsegmental atelectasis is noted. Electronically Signed   By: Marijo Conception, M.D.   On: 03/31/2018 16:54         Discharge Medications: Allergies as of 04/03/2018      Reactions   Coconut Oil Swelling   Anything coconut   Codeine Rash      Medication List    STOP taking these medications   oxyCODONE-acetaminophen 10-325 MG tablet Commonly known as:  PERCOCET     TAKE these medications   acetaminophen 500 MG tablet Commonly known as:  TYLENOL Take 2 tablets (1,000 mg total) by mouth every 6 (six) hours.   albuterol 108 (90 Base) MCG/ACT  inhaler Commonly known as:  PROVENTIL HFA;VENTOLIN HFA Inhale 1-2 puffs into the lungs every 4 (four) hours as needed for wheezing or shortness of breath.   levothyroxine 25 MCG tablet Commonly known as:  SYNTHROID, LEVOTHROID Take 25 mcg by mouth daily before breakfast.   tiZANidine 4 MG tablet Commonly known as:  ZANAFLEX Take 4 mg by mouth at bedtime.   traMADol 50 MG tablet Commonly known as:  ULTRAM Take 1-2 tablets (50-100 mg total) by mouth every 6 (six) hours as needed (mild pain).   vitamin C 500 MG tablet Commonly known as:  ASCORBIC ACID Take 500 mg by mouth every other day.       Follow Up Appointments: Follow-up Information    [name], MD Follow up.   Specialty:  Cardiothoracic Surgery Why:  Your routine follow-up appointment is on 12/31 at 12:30pm. Please arrive at 12:00pm for a chest xray located at [facility] which is on the first floor of our building.  Contact information: [place] [address] [phone]        Neale Burly, MD. Call in 1 day(s).   Specialty:  Internal Medicine Contact information: [address] [place] [address] [phone]        suture removal appointment Follow up.   Why:  Your suture removal appointment is on 12/13 at 11:00am Contact information: Dr. Leonarda Salon office          Signed: Terance Hart ContePA-C 04/03/2018, 12:17 PM

## 2018-04-03 ENCOUNTER — Inpatient Hospital Stay (HOSPITAL_COMMUNITY): Payer: BLUE CROSS/BLUE SHIELD

## 2018-04-03 ENCOUNTER — Encounter (HOSPITAL_COMMUNITY): Payer: Self-pay | Admitting: Thoracic Surgery (Cardiothoracic Vascular Surgery)

## 2018-04-03 MED ORDER — TRAMADOL HCL 50 MG PO TABS: 50.0000 mg | ORAL_TABLET | Freq: Four times a day (QID) | ORAL | 0 refills | Status: DC | PRN

## 2018-04-03 MED ORDER — ACETAMINOPHEN 500 MG PO TABS: 1000.0000 mg | ORAL_TABLET | Freq: Four times a day (QID) | ORAL | 0 refills | Status: AC

## 2018-04-03 NOTE — Progress Notes (Signed)
3 Days Post-Op Procedure(s) (LRB): VIDEO ASSISTED THORACOSCOPY (VATS)/LEFT UPPER LOBECTOMY (Left) Subjective: Some incisional pain  Objective: Vital signs in last 24 hours: Temp:  [98.1 F (36.7 C)-98.4 F (36.9 C)] 98.1 F (36.7 C) (12/09 0455) Pulse Rate:  [80-92] 92 (12/08 1156) Cardiac Rhythm: Normal sinus rhythm (12/09 0700) Resp:  [11-23] 16 (12/09 0455) BP: (106-124)/(63-77) 106/70 (12/09 0455) SpO2:  [95 %-98 %] 98 % (12/09 0455)  Hemodynamic parameters for last 24 hours:    Intake/Output from previous day: 12/08 0701 - 12/09 0700 In: 480 [P.O.:480] Out: 10 [Chest Tube:10] Intake/Output this shift: No intake/output data recorded.  General appearance: alert, cooperative and no distress Neurologic: intact Heart: regular rate and rhythm Lungs: diminished breath sounds left base Wound: clean and dry  Lab Results: Recent Labs    04/01/18 0354 04/02/18 0319  WBC 9.3 9.3  HGB 11.2* 10.5*  HCT 34.7* 33.3*  PLT 259 226   BMET:  Recent Labs    04/01/18 0354 04/02/18 0319  NA 139 141  K 4.1 3.9  CL 108 108  CO2 23 28  GLUCOSE 147* 103*  BUN 9 7  CREATININE 0.69 0.53  CALCIUM 8.4* 8.4*    PT/INR: No results for input(s): LABPROT, INR in the last 72 hours. ABG    Component Value Date/Time   PHART 7.368 04/01/2018 0400   HCO3 23.7 04/01/2018 0400   ACIDBASEDEF 0.9 04/01/2018 0400   O2SAT 98.2 04/01/2018 0400   CBG (last 3)  Recent Labs    04/01/18 0010 04/01/18 0403 04/01/18 0808  GLUCAP 197* 119* 114*    Assessment/Plan: S/P Procedure(s) (LRB): VIDEO ASSISTED THORACOSCOPY (VATS)/LEFT UPPER LOBECTOMY (Left) -POD # 3 Doing well No respiratory issues Pain well controlled with Po meds Dc home today   LOS: 3 days    [name] 04/03/2018

## 2018-04-03 NOTE — Progress Notes (Addendum)
DC summary given to patient. Educated on medication regimen, restrictions and follow up appointments. VSS. All belongings sent home with patient at time of DC. Rx sent home with patient. PIV DC, hemostasis achieved. Pt escorted to private vehicle driven by family via wheelchair by NT.   Tessa PA called re: suture removal appointment. Tessa to set on up for Dec 13, Friday at [facility] gave patient appt time on DC summary.

## 2018-04-03 NOTE — Anesthesia Postprocedure Evaluation (Signed)
Anesthesia Post Note  Patient: Filbert Berthold Mcgrail  Procedure(s) Performed: VIDEO ASSISTED THORACOSCOPY (VATS)/LEFT UPPER LOBECTOMY (Left Chest)     Patient location during evaluation: PACU Anesthesia Type: General Level of consciousness: awake and alert, awake and oriented Pain management: pain level controlled Vital Signs Assessment: post-procedure vital signs reviewed and stable Respiratory status: spontaneous breathing, nonlabored ventilation and respiratory function stable Cardiovascular status: blood pressure returned to baseline and stable Postop Assessment: no apparent nausea or vomiting Anesthetic complications: no    Last Vitals:  Vitals:   04/02/18 2301 04/03/18 0455  BP: 122/77 106/70  Pulse:    Resp: (!) 23 16  Temp: 36.8 C 36.7 C  SpO2: 97% 98%    Last Pain:  Vitals:   04/03/18 0455  TempSrc: Oral  PainSc:                  Catalina Gravel

## 2018-04-03 NOTE — Discharge Instructions (Signed)
Thoracoscopy, Care After °Refer to this sheet in the next few weeks. These instructions provide you with information about caring for yourself after your procedure. Your health care provider may also give you more specific instructions. Your treatment has been planned according to current medical practices, but problems sometimes occur. Call your health care provider if you have any problems or questions after your procedure. °What can I expect after the procedure? °After your procedure, it is common to feel sore for up to two weeks. °Follow these instructions at home: °· There are many different ways to close and cover an incision, including stitches (sutures), skin glue, and adhesive strips. Follow your health care provider's instructions about: °? Incision care. °? Bandage (dressing) changes and removal. °? Incision closure removal. °· Check your incision area every day for signs of infection. Watch for: °? Redness, swelling, or pain. °? Fluid, blood, or pus. °· Take medicines only as directed by your health care provider. °· Try to cough often. Coughing helps to protect against lung infection (pneumonia). It may hurt to cough. If this happens, hold a pillow against your chest when you cough. °· Take deep breaths. This also helps to protect against pneumonia. °· If you were given an incentive spirometer, use it as directed by your health care provider. °· Do not take baths, swim, or use a hot tub until your health care provider approves. You may take showers. °· Avoid lifting until your health care provider approves. °· Avoid driving until your health care provider approves. °· Do not travel by airplane after the chest tube is removed until your health care provider approves. °Contact a health care provider if: °· You have a fever. °· Pain medicines do not ease your pain. °· You have redness, swelling, or increasing pain in your incision area. °· You develop a cough that does not go away, or you are coughing up  mucus that is yellow or green. °Get help right away if: °· You have fluid, blood, or pus coming from your incision. °· There is a bad smell coming from your incision or dressing. °· You develop a rash. °· You have difficulty breathing. °· You cough up blood. °· You develop light-headedness or you feel faint. °· You develop chest pain. °· Your heartbeat feels irregular or very fast. °This information is not intended to replace advice given to you by your health care provider. Make sure you discuss any questions you have with your health care provider. °Document Released: 10/30/2004 Document Revised: 12/14/2015 Document Reviewed: 12/26/2013 °Elsevier Interactive Patient Education © 2018 [employer] ° °

## 2018-04-05 DIAGNOSIS — C3411 Malignant neoplasm of upper lobe, right bronchus or lung: Secondary | ICD-10-CM | POA: Diagnosis not present

## 2018-04-05 DIAGNOSIS — Z6824 Body mass index (BMI) 24.0-24.9, adult: Secondary | ICD-10-CM | POA: Diagnosis not present

## 2018-04-07 ENCOUNTER — Ambulatory Visit (INDEPENDENT_AMBULATORY_CARE_PROVIDER_SITE_OTHER): Payer: Self-pay

## 2018-04-07 DIAGNOSIS — Z4802 Encounter for removal of sutures: Secondary | ICD-10-CM

## 2018-04-07 NOTE — Progress Notes (Signed)
Removed 1 suture from chest tube site, no signs of infection and patient tolerated well.

## 2018-04-11 ENCOUNTER — Telehealth: Payer: Self-pay

## 2018-04-11 NOTE — Telephone Encounter (Signed)
Patient contacted the office stating that she was taking Tramadol for pain but it was not helping.  She stated that she was taking one 50 mg tablet in the morning and then again in the evening before bed.  I advised her to take the medication as prescribed 1-2 tablets every 6 hours as needed for pain but call us back if that did not help.  She acknowledged receipt.  Patient also stated that she is red and swollen at her incision site and up under her left breast.  Advised patient to send a picture through mychart and would give her a call back.  Awaiting images to give further instructions.

## 2018-04-12 ENCOUNTER — Encounter: Payer: Self-pay | Admitting: Thoracic Surgery (Cardiothoracic Vascular Surgery)

## 2018-04-12 ENCOUNTER — Telehealth: Payer: Self-pay

## 2018-04-12 NOTE — Telephone Encounter (Signed)
Patient notified that incision sites looked to be healing nicely.  Patient stated she had some mild swelling, when picture viewed no swelling if any noted.  Patient called and made aware.  She was thankful for the return call.

## 2018-04-12 NOTE — Telephone Encounter (Signed)
Attempted to contact Yolanda [name] and family Yolanda [name] in regards to patient stating yesterday, 04/11/18, that her incision site was red and swollen.  Asked for a picture of wound and one was not received.  Unable to leave a voicemail on Yolanda [name]'s phone, however, I did leave one with family.  Will continue to monitor.

## 2018-04-24 ENCOUNTER — Telehealth: Payer: Self-pay

## 2018-04-24 ENCOUNTER — Other Ambulatory Visit: Payer: Self-pay

## 2018-04-24 ENCOUNTER — Other Ambulatory Visit: Payer: Self-pay | Admitting: Physician Assistant

## 2018-04-24 ENCOUNTER — Other Ambulatory Visit: Payer: Self-pay | Admitting: Thoracic Surgery (Cardiothoracic Vascular Surgery)

## 2018-04-24 DIAGNOSIS — Z902 Acquired absence of lung [part of]: Secondary | ICD-10-CM

## 2018-04-24 MED ORDER — TRAMADOL HCL 50 MG PO TABS: 50.0000 mg | ORAL_TABLET | Freq: Four times a day (QID) | ORAL | 0 refills | Status: AC | PRN

## 2018-04-24 NOTE — Telephone Encounter (Signed)
Attempted to contact patient back to state prescription for Tramadol was sent via fax to [facility] in [place].  Patient did not answer phone and voicemail was not set up.  Will notify patient if she contacts the office back.

## 2018-04-25 ENCOUNTER — Ambulatory Visit
Admission: RE | Admit: 2018-04-25 | Discharge: 2018-04-25 | Disposition: A | Discharge status: Home / Self Care | Payer: BLUE CROSS/BLUE SHIELD | Source: Ambulatory Visit | Attending: Thoracic Surgery (Cardiothoracic Vascular Surgery) | Admitting: Thoracic Surgery (Cardiothoracic Vascular Surgery)

## 2018-04-25 ENCOUNTER — Other Ambulatory Visit: Payer: Self-pay

## 2018-04-25 ENCOUNTER — Encounter: Payer: Self-pay | Admitting: Thoracic Surgery (Cardiothoracic Vascular Surgery)

## 2018-04-25 ENCOUNTER — Ambulatory Visit (INDEPENDENT_AMBULATORY_CARE_PROVIDER_SITE_OTHER): Payer: Self-pay | Admitting: Thoracic Surgery (Cardiothoracic Vascular Surgery)

## 2018-04-25 VITALS — BP 122/80 | HR 74 | Resp 16 | Ht 67.0 in | Wt 144.2 lb

## 2018-04-25 DIAGNOSIS — J984 Other disorders of lung: Secondary | ICD-10-CM | POA: Diagnosis not present

## 2018-04-25 DIAGNOSIS — Z902 Acquired absence of lung [part of]: Secondary | ICD-10-CM

## 2018-04-25 DIAGNOSIS — C3412 Malignant neoplasm of upper lobe, left bronchus or lung: Secondary | ICD-10-CM

## 2018-04-25 NOTE — Progress Notes (Signed)
[address]       [place] [address]             [phone]    HPI: Ms. Yolanda [name] returns for scheduled follow-up visit  Yolanda [name] is a 52 year old woman with a history of tobacco abuse who presented with a respiratory infection.  A chest x-ray showed a left upper lobe opacity.  She eventually was found to have a 2.4 x 2.1 cm left upper lobe nodule.  A PET/CT showed the nodule was hypermetabolic.  She had clinical stage Ia disease.  I did a thoracoscopic left upper lobectomy on 03/31/2018.  The nodule did turn out to be an adenocarcinoma.  It was pathologic stage IB.  Her postoperative course was uncomplicated and she went home on postoperative day #3.  She is still having some incisional pain.  She is taking tramadol about twice a day.  She complains of a burning sensation radiating towards her left nipple.  She denies any shortness of breath.  She denies any leg swelling. Past Medical History:  Diagnosis Date  . Anxiety   . Cervical cancer ([place])   . Chronic pain   . COPD (chronic obstructive pulmonary disease) ([place])   . Depression   . Heart murmur   . Hemorrhoids    bleed frequently  . History of kidney stones   . Mitral valve prolapse   . Pneumonia   . Seizures ([place])   . Thyroid disease      Current Outpatient Medications  Medication Sig Dispense Refill  . acetaminophen (TYLENOL) 500 MG tablet Take 2 tablets (1,000 mg total) by mouth every 6 (six) hours. 30 tablet 0  . albuterol (PROVENTIL HFA;VENTOLIN HFA) 108 (90 Base) MCG/ACT inhaler Inhale 1-2 puffs into the lungs every 4 (four) hours as needed for wheezing or shortness of breath.    . levothyroxine (SYNTHROID, LEVOTHROID) 25 MCG tablet Take 25 mcg by mouth daily before breakfast.    . tiZANidine (ZANAFLEX) 4 MG tablet Take 4 mg by mouth at bedtime.     . traMADol (ULTRAM) 50 MG tablet Take 1 tablet (50 mg total) by mouth every 6 (six) hours as needed for up to 7 days (mild pain). 28 tablet 0  .  vitamin C (ASCORBIC ACID) 500 MG tablet Take 500 mg by mouth every other day.     No current facility-administered medications for this visit.     Physical Exam BP 122/80 (BP Location: Left Arm, Patient Position: Sitting, Cuff Size: Normal)   Pulse 74   Resp 16   Ht 5' 7"  (1.702 m)   Wt 144 lb 3.2 oz (65.4 kg)   LMP 03/30/2007   SpO2 98% Comment: RA  BMI 22.24 kg/m  52 year old woman in no acute distress Alert and oriented x3 with no focal deficits Incisions well-healed Lungs diminished at left base, otherwise clear Cardiac regular rate and rhythm  Diagnostic Tests: CHEST - 2 VIEW  COMPARISON:  Chest x-ray of 04/03/2018, and CT chest of 02/10/2018  FINDINGS: There is volume loss throughout the left hemithorax after prior left upper lobectomy. The left lung and the right lung appear clear. No infiltrate, nodule, or effusion is seen. Mild scarring is present at the left lung base. Mediastinal and hilar contours are stable and the heart is within normal limits in size. No bony abnormality is noted.  IMPRESSION: Stable postop changes in left hemithorax with volume loss and elevation of the left hemidiaphragm. No active  lung disease.   Electronically Signed   By: Ivar Drape M.D.   On: 04/25/2018 11:48 I personally reviewed the chest x-ray images and concur with the findings noted above  Impression: Yolanda [name] is a 52 year old woman with a history of tobacco abuse who had a thoracoscopic left upper lobectomy for a stage IB adenocarcinoma about 3 weeks ago.  Overall she is doing well at this point in time.  She does still have some incisional pain.  She is using tramadol for that.  He does stomach she is having some neuropathic pain.  I discussed possibly using gabapentin or Lyrica for that, but she has had an adverse reaction to gabapentin in the past.  We will continue with tramadol for now.  She is anxious to return to work and would like to start back as soon as  possible.  On Monday release her to start on 04/27/2018.  She should not start driving until she is no longer having to take pain pills during the day.  At that point her activities are unrestricted.  She is requesting to see oncology at [facility].  Apparently she had a run in with a staff member in [place].  Plan: I will plan to see her back in a month to check on her progress.  [name], MD [facility] [phone]

## 2018-05-04 ENCOUNTER — Telehealth: Payer: Self-pay

## 2018-05-04 NOTE — Telephone Encounter (Signed)
Patient contacted the office concerned about being dizzy.  She stated that it gets worse when she lays down.  I advised her to contact her PCP for further work-up.  She acknowledged receipt.

## 2018-05-08 ENCOUNTER — Telehealth: Payer: Self-pay

## 2018-05-08 ENCOUNTER — Other Ambulatory Visit: Payer: Self-pay

## 2018-05-08 MED ORDER — TRAMADOL HCL 50 MG PO TABS: 50.0000 mg | ORAL_TABLET | Freq: Four times a day (QID) | ORAL | 0 refills | Status: AC | PRN

## 2018-05-08 NOTE — Telephone Encounter (Signed)
-----   Message from [name], MD sent at 05/08/2018 12:11 PM EST ----- Eustace Quail ----- Message ----- From: Donnella Sham, RN Sent: 05/08/2018  10:53 AM EST To: [name], MD  Patient called this morning requesting a refill of Tramadol.  She states she takes about 2 a day.  Surgery 03/31/2018 D/C'd with Tramadol 04/03/2018 Refill #1 04/24/2018  I noticed in your last note that pain was managed with Tramadol.  Can she have a refill?  If so, I will have a PA sign it for me when they come to office.  If not, I will tell her to start taking Tylenol and Ibuprofen.  Thanks,  Caryl Pina

## 2018-05-08 NOTE — Telephone Encounter (Signed)
Patient contacted regarding medication refill of Tramadol 50mg  Q6h PRN.  Patient given refill approval by Dr. Roxan Hockey.  Advised patient that this would probably be last refill given.  Hinton Lovely, PA signed prescription.  Prescription faxed into [facility] at patient's request.  Patient aware.

## 2018-05-09 ENCOUNTER — Encounter (HOSPITAL_COMMUNITY): Payer: Self-pay | Admitting: Hematology

## 2018-05-09 ENCOUNTER — Inpatient Hospital Stay (HOSPITAL_COMMUNITY): Payer: BLUE CROSS/BLUE SHIELD | Attending: Hematology | Admitting: Hematology

## 2018-05-09 ENCOUNTER — Other Ambulatory Visit: Payer: Self-pay

## 2018-05-09 VITALS — BP 131/86 | HR 94 | Temp 98.2°F | Resp 16 | Ht 65.0 in | Wt 143.4 lb

## 2018-05-09 DIAGNOSIS — Z87891 Personal history of nicotine dependence: Secondary | ICD-10-CM

## 2018-05-09 DIAGNOSIS — Z79899 Other long term (current) drug therapy: Secondary | ICD-10-CM

## 2018-05-09 DIAGNOSIS — F329 Major depressive disorder, single episode, unspecified: Secondary | ICD-10-CM | POA: Diagnosis not present

## 2018-05-09 DIAGNOSIS — Z8541 Personal history of malignant neoplasm of cervix uteri: Secondary | ICD-10-CM | POA: Insufficient documentation

## 2018-05-09 DIAGNOSIS — Z86010 Personal history of colonic polyps: Secondary | ICD-10-CM | POA: Insufficient documentation

## 2018-05-09 DIAGNOSIS — Z8701 Personal history of pneumonia (recurrent): Secondary | ICD-10-CM | POA: Diagnosis not present

## 2018-05-09 DIAGNOSIS — R42 Dizziness and giddiness: Secondary | ICD-10-CM | POA: Insufficient documentation

## 2018-05-09 DIAGNOSIS — E079 Disorder of thyroid, unspecified: Secondary | ICD-10-CM | POA: Diagnosis not present

## 2018-05-09 DIAGNOSIS — J449 Chronic obstructive pulmonary disease, unspecified: Secondary | ICD-10-CM | POA: Insufficient documentation

## 2018-05-09 DIAGNOSIS — Z902 Acquired absence of lung [part of]: Secondary | ICD-10-CM

## 2018-05-09 DIAGNOSIS — K625 Hemorrhage of anus and rectum: Secondary | ICD-10-CM | POA: Diagnosis not present

## 2018-05-09 DIAGNOSIS — Z800 Family history of malignant neoplasm of digestive organs: Secondary | ICD-10-CM | POA: Insufficient documentation

## 2018-05-09 DIAGNOSIS — H538 Other visual disturbances: Secondary | ICD-10-CM | POA: Insufficient documentation

## 2018-05-09 DIAGNOSIS — I341 Nonrheumatic mitral (valve) prolapse: Secondary | ICD-10-CM

## 2018-05-09 DIAGNOSIS — Z87442 Personal history of urinary calculi: Secondary | ICD-10-CM

## 2018-05-09 DIAGNOSIS — G8929 Other chronic pain: Secondary | ICD-10-CM | POA: Diagnosis not present

## 2018-05-09 DIAGNOSIS — Z803 Family history of malignant neoplasm of breast: Secondary | ICD-10-CM | POA: Diagnosis not present

## 2018-05-09 DIAGNOSIS — C3492 Malignant neoplasm of unspecified part of left bronchus or lung: Secondary | ICD-10-CM | POA: Diagnosis not present

## 2018-05-09 NOTE — Patient Instructions (Signed)
[facility] Discharge Instructions     Thank you for choosing [facility] at [facility] to provide your oncology and hematology care.  To afford each patient quality time with our provider, please arrive at least 15 minutes before your scheduled appointment time.   If you have a lab appointment with the Cancer Center please come in thru the  Main Entrance and check in at the main information desk  You need to re-schedule your appointment should you arrive 10 or more minutes late.  We strive to give you quality time with our providers, and arriving late affects you and other patients whose appointments are after yours.  Also, if you no show three or more times for appointments you may be dismissed from the clinic at the providers discretion.     Again, thank you for choosing [facility].  Our hope is that these requests will decrease the amount of time that you wait before being seen by our physicians.       _____________________________________________________________  Should you have questions after your visit to [facility], please contact our office at [phone] between the hours of 8:00 a.m. and 4:30 p.m.  Voicemails left after 4:00 p.m. will not be returned until the following business day.  For prescription refill requests, have your pharmacy contact our office and allow 72 hours.    Cancer Center Support Programs:   > Cancer Support Group  2nd Tuesday of the month 1pm-2pm, Journey Room    

## 2018-05-09 NOTE — Assessment & Plan Note (Signed)
1.  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 - Had pneumonia in October 2019, found to have left upper lobe lung nodule on imaging. - Had a PET CT scan on 03/06/2018 by Dr. Sherrie Sport which showed left upper lobe pulmonary nodule measuring 2.8 x 2.1 cm, no lymphadenopathy. - She underwent left VATS, left upper lobectomy and mediastinal lymph node dissection on 03/31/2018. - I discussed the pathology report with the patient in detail.  She has 3.9 cm tumor, poorly differentiated adenocarcinoma.  0 out of 12 lymph nodes were positive.  Visceral pleural invasion, lymphovascular invasion was not identified.  It was a unifocal tumor. - Her only high risk feature was poorly differentiated tumor.  We reviewed the NCCN guidelines which recommend chemotherapy for patients with high risk features. - She has been having lightheadedness for the past few weeks.  She also has slight blurring of vision.  We will order MRI of the brain with and without gadolinium. -We will see her back after the MRI and discuss further options.  2.  Rectal bleeding: -She complains of rectal bleeding when she goes to the bathroom which started 2 weeks prior to surgery. - She reportedly had a history of similar bleeding 4 years ago and underwent colonoscopy by Dr. Anthony Sar which showed hemorrhoids and 2 polyps removed. -Recent PET CT scan showed slightly increased uptake in the anus without a CT correlate, possibly nonspecific. - She is scheduled to see him on 22nd of this month.  3.  Family history: -Mother had breast cancer.  Brother had colon cancer and 2 paternal aunts had colon cancer. -1 paternal cousin had leukemia. -We will make a referral to our geneticist for further evaluation.

## 2018-05-09 NOTE — Progress Notes (Signed)
[facility] NOTE  Patient Care Team: Neale Burly, MD as PCP - General (Internal Medicine)  CHIEF COMPLAINTS/PURPOSE OF CONSULTATION: Poorly differentiated adenocarcinoma of the lung  HISTORY OF PRESENTING ILLNESS:  Yolanda [name] 53 y.o. female is here because of new diagnoses of non small cell lung cancer. She is an otherwise healthy female went to the Emergency room on December 6th 2019 of SOB. She was diagnosed with pneumonia and sent home with antibiotics. She completed her course but continued to get worse. She went back to the ER and they did a CT scan with showed a lung mass. She was then referred to Dr. Roxan Hockey for a resection. She has been doing good since her surgery she does report SOB with exertion. Her wounds are healing nicely no redness or swelling. She reports having blurred vision and dizziness since before her surgery. She is is also having bleeding when she has a bowel movement and reports blood clots. She has an appointment with Dr. Anthony Sar on Jan 22nd to schedule a colonoscopy. Her last colonoscopy was 4 years ago and showed 2 polyps and hemorrhoids.   Denies any nausea, vomiting, or diarrhea. Denies any new pains. Had not noticed any recent bleeding such as epistaxis or hematuria. Denies recent chest pain on exertion, pre-syncopal episodes, or palpitations. Denies any numbness or tingling in hands or feet. Denies any recent fevers, infections, or recent hospitalizations. Denies any alcohol or illegal drugs.    She has a history of smoking 1/2 pack per day since she was 53 years old. She stopped the day she was diagnosed. She has a history of cervical cancer in 1987. She received no therapy or surgery for this. She has a family history of cancer. Her mother had breast cancer. Her brother and 2 paternal aunts had colon cancer. She also has a cousin newly diagnosed with leukemia. She lives at home with her husband and his uncle. They are both  heavy smokers. 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   MEDICAL HISTORY:  Past Medical History:  Diagnosis Date  . Anxiety   . Chronic pain   . COPD (chronic obstructive pulmonary disease) ([place])   . Depression   . Heart murmur   . Hemorrhoids    bleed frequently  . History of kidney stones   . Mitral valve prolapse   . Pneumonia   . Seizures ([place])   . Thyroid disease     SURGICAL HISTORY: Past Surgical History:  Procedure Laterality Date  . BACK SURGERY    . VIDEO ASSISTED THORACOSCOPY (VATS)/ LOBECTOMY Left 03/31/2018   Procedure: VIDEO ASSISTED THORACOSCOPY (VATS)/LEFT UPPER LOBECTOMY;  Surgeon: [name], MD;  Location: [facility];  Service: Thoracic;  Laterality: Left;  . WISDOM TOOTH EXTRACTION      SOCIAL HISTORY: Social History   Socioeconomic History  . Marital status: Married    Spouse name: Shailah Gibbins  . Number of children: 2  . Years of education: Not on file  . Highest education level: Not on file  Occupational History  . Not on file  Social Needs  . Financial resource strain: Not very hard  . Food insecurity:    Worry: Never true    Inability: Never true  . Transportation needs:    Medical: No    Non-medical: No  Tobacco Use  . Smoking status: Former Smoker    Packs/day: 1.00    Years: 35.00  Pack years: 35.00    Types: Cigarettes    Last attempt to quit: 02/10/2018    Years since quitting: 0.2  . Smokeless tobacco: Never Used  Substance and Sexual Activity  . Alcohol use: No  . Drug use: No  . Sexual activity: Not Currently    Birth control/protection: Abstinence  Lifestyle  . Physical activity:    Days per week: 5 days    Minutes per session: 60 min  . Stress: Very much  Relationships  . Social connections:    Talks on phone: Twice a week    Gets together: Twice a week    Attends religious service: Never    Active member of club or organization: No    Attends meetings of clubs or organizations:  Never    Relationship status: Married  . Intimate partner violence:    Fear of current or ex partner: No    Emotionally abused: No    Physically abused: No    Forced sexual activity: No  Other Topics Concern  . Not on file  Social History Narrative  . Not on file    FAMILY HISTORY: Family History  Problem Relation Age of Onset  . Cancer Mother   . Cancer Brother   . Clotting disorder Paternal Aunt     ALLERGIES:  is allergic to coconut oil and codeine.  MEDICATIONS:  Current Outpatient Medications  Medication Sig Dispense Refill  . acetaminophen (TYLENOL) 500 MG tablet Take 2 tablets (1,000 mg total) by mouth every 6 (six) hours. 30 tablet 0  . albuterol (PROVENTIL HFA;VENTOLIN HFA) 108 (90 Base) MCG/ACT inhaler Inhale 1-2 puffs into the lungs every 4 (four) hours as needed for wheezing or shortness of breath.    . levothyroxine (SYNTHROID, LEVOTHROID) 25 MCG tablet Take 25 mcg by mouth daily before breakfast.    . tiZANidine (ZANAFLEX) 4 MG tablet Take 4 mg by mouth at bedtime.     . traMADol (ULTRAM) 50 MG tablet Take 1 tablet (50 mg total) by mouth every 6 (six) hours as needed for up to 7 days for severe pain. 28 tablet 0  . vitamin C (ASCORBIC ACID) 500 MG tablet Take 500 mg by mouth every other day.    . oxyCODONE-acetaminophen (PERCOCET) 10-325 MG tablet Take 1 tablet by mouth every 6 (six) hours as needed for pain.     No current facility-administered medications for this visit.     REVIEW OF SYSTEMS:   Constitutional: Denies fevers, chills or abnormal night sweats Eyes: Denies blurriness of vision, double vision or watery eyes Ears, nose, mouth, throat, and face: Denies mucositis or sore throat Respiratory: +SOB Denies cough wheezes Cardiovascular: Denies palpitation, chest discomfort or lower extremity swelling Gastrointestinal:  +blood in stool Denies nausea, heartburn Skin: Denies abnormal skin rashes Lymphatics: Denies new lymphadenopathy or easy  bruising Neurological:Denies numbness, tingling or new weaknesses Behavioral/Psych: Mood is stable, no new changes  All other systems were reviewed with the patient and are negative.  PHYSICAL EXAMINATION: ECOG PERFORMANCE STATUS: 1 - Symptomatic but completely ambulatory  Vitals:   05/09/18 1300  BP: 131/86  Pulse: 94  Resp: 16  Temp: 98.2 F (36.8 C)  SpO2: 100%   Filed Weights   05/09/18 1300  Weight: 143 lb 7 oz (65.1 kg)    GENERAL:alert, no distress and comfortable SKIN: skin color, texture, turgor are normal, no rashes or significant lesions EYES: normal, conjunctiva are pink and non-injected, sclera clear OROPHARYNX:no exudate, no erythema and lips,  buccal mucosa, and tongue normal  NECK: supple, thyroid normal size, non-tender, without nodularity LYMPH:  no palpable lymphadenopathy in the cervical, axillary or inguinal LUNGS: clear to auscultation and percussion with normal breathing effort HEART: regular rate & rhythm and no murmurs and no lower extremity edema ABDOMEN:abdomen soft, non-tender and normal bowel sounds Musculoskeletal:no cyanosis of digits and no clubbing  PSYCH: alert & oriented x 3 with fluent speech NEURO: no focal motor/sensory deficits Recent surgical scars are well-healed. LABORATORY DATA:  I have reviewed the data as listed Lab Results  Component Value Date   WBC 9.3 04/02/2018   HGB 10.5 (L) 04/02/2018   HCT 33.3 (L) 04/02/2018   MCV 97.4 04/02/2018   PLT 226 04/02/2018     Chemistry      Component Value Date/Time   NA 141 04/02/2018 0319   K 3.9 04/02/2018 0319   CL 108 04/02/2018 0319   CO2 28 04/02/2018 0319   BUN 7 04/02/2018 0319   CREATININE 0.53 04/02/2018 0319      Component Value Date/Time   CALCIUM 8.4 (L) 04/02/2018 0319   ALKPHOS 67 04/02/2018 0319   AST 19 04/02/2018 0319   ALT 18 04/02/2018 0319   BILITOT 0.6 04/02/2018 0319       RADIOGRAPHIC STUDIES: I have personally reviewed the radiological images as  listed and agreed with the findings in the report. Dg Chest 2 View  Result Date: 04/25/2018 CLINICAL DATA:  Post left upper lobectomy EXAM: CHEST - 2 VIEW COMPARISON:  Chest x-ray of 04/03/2018, and CT chest of 02/10/2018 FINDINGS: There is volume loss throughout the left hemithorax after prior left upper lobectomy. The left lung and the right lung appear clear. No infiltrate, nodule, or effusion is seen. Mild scarring is present at the left lung base. Mediastinal and hilar contours are stable and the heart is within normal limits in size. No bony abnormality is noted. IMPRESSION: Stable postop changes in left hemithorax with volume loss and elevation of the left hemidiaphragm. No active lung disease. Electronically Signed   By: Ivar Drape M.D.   On: 04/25/2018 11:48    ASSESSMENT & PLAN:  Adenocarcinoma of left lung, stage 1 (HCC) 1.  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 - Had pneumonia in October 2019, found to have left upper lobe lung nodule on imaging. - Had a PET CT scan on 03/06/2018 by Dr. Sherrie Sport which showed left upper lobe pulmonary nodule measuring 2.8 x 2.1 cm, no lymphadenopathy. - She underwent left VATS, left upper lobectomy and mediastinal lymph node dissection on 03/31/2018. - I discussed the pathology report with the patient in detail.  She has 3.9 cm tumor, poorly differentiated adenocarcinoma.  0 out of 12 lymph nodes were positive.  Visceral pleural invasion, lymphovascular invasion was not identified.  It was a unifocal tumor. - Her only high risk feature was poorly differentiated tumor.  We reviewed the NCCN guidelines which recommend chemotherapy for patients with high risk features. - She has been having lightheadedness for the past few weeks.  She also has slight blurring of vision.  We will order MRI of the brain with and without gadolinium. -We will see her back after the MRI  and discuss further options.  2.  Rectal bleeding: -She complains of rectal bleeding when she goes to the bathroom which started 2 weeks prior to surgery. - She reportedly had a history of similar  bleeding 4 years ago and underwent colonoscopy by Dr. Anthony Sar which showed hemorrhoids and 2 polyps removed. -Recent PET CT scan showed slightly increased uptake in the anus without a CT correlate, possibly nonspecific. - She is scheduled to see him on 22nd of this month.  3.  Family history: -Mother had breast cancer.  Brother had colon cancer and 2 paternal aunts had colon cancer. -1 paternal cousin had leukemia. -We will make a referral to our geneticist for further evaluation.  Orders Placed This Encounter  Procedures  . MR Brain W Wo Contrast    Standing Status:   Future    Standing Expiration Date:   05/09/2019    Order Specific Question:   ** REASON FOR EXAM (FREE TEXT)    Answer:   adenocarcinoma of the lung, dizziness and vision changes    Order Specific Question:   If indicated for the ordered procedure, I authorize the administration of contrast media per Radiology protocol    Answer:   Yes    Order Specific Question:   What is the patient's sedation requirement?    Answer:   No Sedation    Order Specific Question:   Does the patient have a pacemaker or implanted devices?    Answer:   No    Order Specific Question:   Use SRS Protocol?    Answer:   Yes    Order Specific Question:   Radiology Contrast Protocol - do NOT remove file path    Answer:   [email or web address]    Order Specific Question:   Preferred imaging location?    Answer:   [facility] (table limit-350lbs)  . Ambulatory referral to Social Work    Referral Priority:   Routine    Referral Type:   Consultation    Referral Reason:   Specialty Services Required    Number of Visits Requested:   1    All questions were answered. The patient knows to call the clinic with any problems,  questions or concerns.      Derek Jack, MD 05/09/2018 4:55 PM

## 2018-05-11 ENCOUNTER — Encounter: Payer: Self-pay | Admitting: General Practice

## 2018-05-11 ENCOUNTER — Telehealth: Payer: Self-pay | Admitting: General Practice

## 2018-05-11 NOTE — Telephone Encounter (Signed)
[place] Psychosocial Distress Screening Clinical Social Work  Clinical Social Work was referred by distress screening protocol.  The patient scored a 7 on the Psychosocial Distress Thermometer which indicates moderate distress. Clinical Social Worker contacted patient by phone to assess for distress and other psychosocial needs. Not at home, was asked to call back later.    ONCBCN DISTRESS SCREENING 05/09/2018  Screening Type Initial Screening  Distress experienced in past week (1-10) 7  Practical problem type Food;Housing  Emotional problem type Depression  Information Concerns Type Lack of info about diagnosis  Physical Problem type Nausea/vomiting;Sleep/insomnia  Physician notified of physical symptoms Yes    Clinical Social Worker follow up needed: Yes, will attempt recontact.   If yes, follow up plan:  Beverely Pace, [place], LCSW Clinical Social Worker Phone:  [phone]

## 2018-05-11 NOTE — Progress Notes (Signed)
[place] Psychosocial Distress Screening Clinical Social Work  Clinical Social Work was referred by distress screening protocol.  The patient scored a 7 on the Psychosocial Distress Thermometer which indicates moderate distress. Clinical Social Worker contacted patient by phone to assess for distress and other psychosocial needs. "Both MDs are telling me I am cancer free, I don't see why I need to do chemo, I was told I have no cancer in my body whatsoever, I am having a hard time understanding why they want to do chemo and an MRI."  Concerned about the side effects of chemo, "I am not in w it, don't want to go through something I don't have to." Encouraged patient to have frank discussion w oncologist on pros/cons of treatment and to have clear understanding of her options.   Diagnosis of cancer was a surprise to patient as it was found when she was experiencing symptoms of pneumonia.    Has history of depression, is not on antidepressants.  Depressive symptoms have increased since surgery.  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  Is still able to work without difficulty at this time.  Has missed a month of work due to illness, is now behind on bills for rent and food, financially stressed.  Values relationship w 27 year old daughter, wants to remain healthy for daughter's sake.    Will refer patient to [place] for financial help, will also refer to Levada Dy at [facility] for grant application.  Will provide [employer] gas card program application when patient comes to clinic on Tuesday.     ONCBCN DISTRESS SCREENING 05/09/2018  Screening Type Initial Screening  Distress experienced in past week (1-10) 7  Practical problem type Food;Housing  Emotional problem type Depression  Information Concerns Type Lack of info about diagnosis  Physical Problem type Nausea/vomiting;Sleep/insomnia  Physician notified of physical symptoms Yes     Clinical Social Worker follow up needed: No.  If yes, follow up plan:  Beverely Pace, [place], LCSW Clinical Social Worker Phone:  [phone]

## 2018-05-16 ENCOUNTER — Telehealth: Payer: Self-pay

## 2018-05-16 ENCOUNTER — Ambulatory Visit (HOSPITAL_COMMUNITY)
Admission: RE | Admit: 2018-05-16 | Discharge: 2018-05-16 | Disposition: A | Discharge status: Home / Self Care | Payer: BLUE CROSS/BLUE SHIELD | Source: Ambulatory Visit | Attending: Nurse Practitioner | Admitting: Nurse Practitioner

## 2018-05-16 DIAGNOSIS — Z902 Acquired absence of lung [part of]: Secondary | ICD-10-CM

## 2018-05-16 DIAGNOSIS — F329 Major depressive disorder, single episode, unspecified: Secondary | ICD-10-CM | POA: Diagnosis not present

## 2018-05-16 MED ORDER — GADOBUTROL 1 MMOL/ML IV SOLN
6.0000 mL | Freq: Once | INTRAVENOUS | Status: AC | PRN
  Administered 2018-05-16: 6 mL via INTRAVENOUS

## 2018-05-16 NOTE — Telephone Encounter (Signed)
Yolanda [name] contacted the office concerned about her heightened sense of smell.  She stated that it started right after surgery but has since gotten worse.  She stated things that used to smell good to her not longer does.  Dr. Roxan Hockey advised that this is sometimes a side effect of anesthesia and should resolve.  He stated if it does not, he would talk with her more about it at her follow-up appointment in February.  She acknowledged receipt and thanked me for the call.

## 2018-05-17 ENCOUNTER — Encounter (HOSPITAL_COMMUNITY): Payer: Self-pay | Admitting: Hematology

## 2018-05-17 ENCOUNTER — Inpatient Hospital Stay (HOSPITAL_BASED_OUTPATIENT_CLINIC_OR_DEPARTMENT_OTHER): Payer: BLUE CROSS/BLUE SHIELD | Admitting: Hematology

## 2018-05-17 ENCOUNTER — Telehealth (HOSPITAL_COMMUNITY): Payer: Self-pay | Admitting: Hematology

## 2018-05-17 VITALS — BP 123/70 | HR 66 | Temp 98.3°F | Resp 18 | Wt 144.1 lb

## 2018-05-17 DIAGNOSIS — I341 Nonrheumatic mitral (valve) prolapse: Secondary | ICD-10-CM | POA: Diagnosis not present

## 2018-05-17 DIAGNOSIS — Z803 Family history of malignant neoplasm of breast: Secondary | ICD-10-CM | POA: Diagnosis not present

## 2018-05-17 DIAGNOSIS — E079 Disorder of thyroid, unspecified: Secondary | ICD-10-CM | POA: Diagnosis not present

## 2018-05-17 DIAGNOSIS — K625 Hemorrhage of anus and rectum: Secondary | ICD-10-CM

## 2018-05-17 DIAGNOSIS — J449 Chronic obstructive pulmonary disease, unspecified: Secondary | ICD-10-CM | POA: Diagnosis not present

## 2018-05-17 DIAGNOSIS — F329 Major depressive disorder, single episode, unspecified: Secondary | ICD-10-CM

## 2018-05-17 DIAGNOSIS — G8929 Other chronic pain: Secondary | ICD-10-CM | POA: Diagnosis not present

## 2018-05-17 DIAGNOSIS — Z8541 Personal history of malignant neoplasm of cervix uteri: Secondary | ICD-10-CM

## 2018-05-17 DIAGNOSIS — Z8701 Personal history of pneumonia (recurrent): Secondary | ICD-10-CM | POA: Diagnosis not present

## 2018-05-17 DIAGNOSIS — Z87891 Personal history of nicotine dependence: Secondary | ICD-10-CM

## 2018-05-17 DIAGNOSIS — R42 Dizziness and giddiness: Secondary | ICD-10-CM | POA: Diagnosis not present

## 2018-05-17 DIAGNOSIS — Z86010 Personal history of colonic polyps: Secondary | ICD-10-CM | POA: Diagnosis not present

## 2018-05-17 DIAGNOSIS — H538 Other visual disturbances: Secondary | ICD-10-CM | POA: Diagnosis not present

## 2018-05-17 DIAGNOSIS — Z800 Family history of malignant neoplasm of digestive organs: Secondary | ICD-10-CM | POA: Diagnosis not present

## 2018-05-17 DIAGNOSIS — Z87442 Personal history of urinary calculi: Secondary | ICD-10-CM

## 2018-05-17 DIAGNOSIS — Z79899 Other long term (current) drug therapy: Secondary | ICD-10-CM

## 2018-05-17 DIAGNOSIS — F32A Depression, unspecified: Secondary | ICD-10-CM

## 2018-05-17 DIAGNOSIS — C3492 Malignant neoplasm of unspecified part of left bronchus or lung: Secondary | ICD-10-CM

## 2018-05-17 MED ORDER — VENLAFAXINE HCL ER 37.5 MG PO CP24: 37.5000 mg | ORAL_CAPSULE | Freq: Every day | ORAL | 4 refills | Status: DC

## 2018-05-17 NOTE — Assessment & Plan Note (Signed)
1.  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 - Had pneumonia in October 2019, found to have left upper lobe lung nodule on imaging. - Had a PET CT scan on 03/06/2018 by Dr. Sherrie Sport which showed left upper lobe pulmonary nodule measuring 2.8 x 2.1 cm, no lymphadenopathy. - She underwent left VATS, left upper lobectomy and mediastinal lymph node dissection on 03/31/2018. - I discussed the pathology report with the patient in detail.  She has 3.9 cm tumor, poorly differentiated adenocarcinoma.  0 out of 12 lymph nodes were positive.  Visceral pleural invasion, lymphovascular invasion was not identified.  It was a unifocal tumor. - We reviewed the results of the MRI of the brain dated 05/16/2018 which did not show any evidence of metastatic disease. - As her tumor was poorly differentiated, I have offered adjuvant chemotherapy with 4 cycles of cisplatin and pemetrexed. -We discussed the side effects of chemotherapy regimen in detail. -After some thinking, she decided not to do any chemotherapy.  She apparently does not have any benefits from her job and has to work.  She is also depressed since the diagnosis. -I have scheduled her for a 38-month follow-up with repeat CT scan. -I have prescribed her venlafaxine 37.5 mg daily for her depression.  We discussed about the side effects in detail.  2.  Rectal bleeding: -She complains of rectal bleeding when she goes to the bathroom which started 2 weeks prior to surgery. - She reportedly had a history of similar bleeding 4 years ago and underwent colonoscopy by Dr. Anthony Sar which showed hemorrhoids and 2 polyps removed. -Recent PET CT scan showed slightly increased uptake in the anus without a CT correlate, possibly nonspecific. -She does report occasional bleeding per rectum from her hemorrhoids. - She will see her surgeon later this month.  3.  Family history: -Mother  had breast cancer.  Brother had colon cancer and 2 paternal aunts had colon cancer. -1 paternal cousin had leukemia. -She has an appointment to see our geneticist for further evaluation.

## 2018-05-17 NOTE — Progress Notes (Signed)
[address], [place] [address]   CLINIC:  Medical Oncology/Hematology  PCP:  Neale Burly, MD [address] [address] [phone]   REASON FOR VISIT: Follow-up for poorly differentiated adenocarcinoma of the lung  CURRENT THERAPY: post surgery   INTERVAL HISTORY:  Ms. Yolanda [name] 53 y.o. female returns for routine follow-up for poorly differentiated adenocarcinoma of the lung. She is here today with her husband. She is doing better than last time. She still has SOB with exertion and dizziness. She did not have her colonoscopy due to the doctor rescheduled her visit. She is still having blood in her stool. Denies any nausea, vomiting, or diarrhea. Denies any new pains. Had not noticed any recent bleeding such as epistaxis, hematuria or hematochezia. Denies recent chest pain on exertion, shortness of breath on minimal exertion, pre-syncopal episodes, or palpitations. Denies any numbness or tingling in hands or feet. Denies any recent fevers, infections, or recent hospitalizations. Patient reports appetite at 75% and energy level at 25%.   REVIEW OF SYSTEMS:  Review of Systems  Respiratory: Positive for shortness of breath (only with exertion).   Gastrointestinal: Positive for blood in stool.  Neurological: Positive for dizziness.  All other systems reviewed and are negative.    PAST MEDICAL/SURGICAL HISTORY:  Past Medical History:  Diagnosis Date  . Anxiety   . Chronic pain   . COPD (chronic obstructive pulmonary disease) ([place])   . Depression   . Heart murmur   . Hemorrhoids    bleed frequently  . History of kidney stones   . Mitral valve prolapse   . Pneumonia   . Seizures ([place])   . Thyroid disease    Past Surgical History:  Procedure Laterality Date  . BACK SURGERY    . VIDEO ASSISTED THORACOSCOPY (VATS)/ LOBECTOMY Left 03/31/2018   Procedure: VIDEO ASSISTED THORACOSCOPY (VATS)/LEFT UPPER LOBECTOMY;  Surgeon:  [name], MD;  Location: [facility];  Service: Thoracic;  Laterality: Left;  . WISDOM TOOTH EXTRACTION       SOCIAL HISTORY:  Social History   Socioeconomic History  . Marital status: Married    Spouse name: Mckenze Slone  . Number of children: 2  . Years of education: Not on file  . Highest education level: Not on file  Occupational History  . Not on file  Social Needs  . Financial resource strain: Not very hard  . Food insecurity:    Worry: Never true    Inability: Never true  . Transportation needs:    Medical: No    Non-medical: No  Tobacco Use  . Smoking status: Former Smoker    Packs/day: 1.00    Years: 35.00    Pack years: 35.00    Types: Cigarettes    Last attempt to quit: 02/10/2018    Years since quitting: 0.2  . Smokeless tobacco: Never Used  Substance and Sexual Activity  . Alcohol use: No  . Drug use: No  . Sexual activity: Not Currently    Birth control/protection: Abstinence  Lifestyle  . Physical activity:    Days per week: 5 days    Minutes per session: 60 min  . Stress: Very much  Relationships  . Social connections:    Talks on phone: Twice a week    Gets together: Twice a week    Attends religious service: Never    Active member of club or organization: No    Attends meetings of  clubs or organizations: Never    Relationship status: Married  . Intimate partner violence:    Fear of current or ex partner: No    Emotionally abused: No    Physically abused: No    Forced sexual activity: No  Other Topics Concern  . Not on file  Social History Narrative  . Not on file    FAMILY HISTORY:  Family History  Problem Relation Age of Onset  . Cancer Mother   . Cancer Brother   . Clotting disorder Paternal Aunt     CURRENT MEDICATIONS:  Outpatient Encounter Medications as of 05/17/2018  Medication Sig  . acetaminophen (TYLENOL) 500 MG tablet Take 2 tablets (1,000 mg total) by mouth every 6 (six) hours.  Marland Kitchen albuterol (PROVENTIL  HFA;VENTOLIN HFA) 108 (90 Base) MCG/ACT inhaler Inhale 1-2 puffs into the lungs every 4 (four) hours as needed for wheezing or shortness of breath.  . oxyCODONE-acetaminophen (PERCOCET) 10-325 MG tablet Take 1 tablet by mouth every 6 (six) hours as needed for pain.  Marland Kitchen tiZANidine (ZANAFLEX) 4 MG tablet Take 4 mg by mouth at bedtime.   . vitamin C (ASCORBIC ACID) 500 MG tablet Take 500 mg by mouth every other day.  . levothyroxine (SYNTHROID, LEVOTHROID) 25 MCG tablet Take 25 mcg by mouth daily before breakfast.  . venlafaxine XR (EFFEXOR-XR) 37.5 MG 24 hr capsule Take 1 capsule (37.5 mg total) by mouth at bedtime.   No facility-administered encounter medications on file as of 05/17/2018.     ALLERGIES:  Allergies  Allergen Reactions  . Coconut Oil Swelling    Anything coconut  . Codeine Rash     PHYSICAL EXAM:  ECOG Performance status: 1  Vitals:   05/17/18 0855  BP: 123/70  Pulse: 66  Resp: 18  Temp: 98.3 F (36.8 C)  SpO2: 100%   Filed Weights   05/17/18 0855  Weight: 144 lb 1.6 oz (65.4 kg)    Physical Exam Constitutional:      Appearance: Normal appearance. She is normal weight.  Cardiovascular:     Rate and Rhythm: Normal rate and regular rhythm.     Heart sounds: Normal heart sounds.  Pulmonary:     Effort: Pulmonary effort is normal.     Breath sounds: Normal breath sounds.  Abdominal:     General: Abdomen is flat.     Palpations: Abdomen is soft.  Musculoskeletal: Normal range of motion.  Skin:    General: Skin is warm and dry.  Neurological:     Mental Status: She is alert and oriented to person, place, and time. Mental status is at baseline.  Psychiatric:        Mood and Affect: Mood normal.        Behavior: Behavior normal.        Thought Content: Thought content normal.        Judgment: Judgment normal.      LABORATORY DATA:  I have reviewed the labs as listed.  CBC    Component Value Date/Time   WBC 9.3 04/02/2018 0319   RBC 3.42 (L)  04/02/2018 0319   HGB 10.5 (L) 04/02/2018 0319   HCT 33.3 (L) 04/02/2018 0319   PLT 226 04/02/2018 0319   MCV 97.4 04/02/2018 0319   MCH 30.7 04/02/2018 0319   MCHC 31.5 04/02/2018 0319   RDW 12.4 04/02/2018 0319   CMP Latest Ref Rng & Units 04/02/2018 04/01/2018 03/29/2018  Glucose 70 - 99 mg/dL 103(H) 147(H) 107(H)  BUN 6 -  20 mg/dL 7 9 12   Creatinine 0.44 - 1.00 mg/dL 0.53 0.69 0.65  Sodium 135 - 145 mmol/L 141 139 142  Potassium 3.5 - 5.1 mmol/L 3.9 4.1 3.7  Chloride 98 - 111 mmol/L 108 108 106  CO2 22 - 32 mmol/L 28 23 25   Calcium 8.9 - 10.3 mg/dL 8.4(L) 8.4(L) 9.3  Total Protein 6.5 - 8.1 g/dL 5.4(L) - 7.1  Total Bilirubin 0.3 - 1.2 mg/dL 0.6 - 0.3  Alkaline Phos 38 - 126 U/L 67 - 86  AST 15 - 41 U/L 19 - 26  ALT 0 - 44 U/L 18 - 25       DIAGNOSTIC IMAGING:  I have independently reviewed the scans and discussed with the patient.   I have reviewed Francene Finders, NP's note and agree with the documentation.  I personally performed a face-to-face visit, made revisions and my assessment and plan is as follows.    ASSESSMENT & PLAN:   Adenocarcinoma of left lung, stage 1 (HCC) 1.  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 - Had pneumonia in October 2019, found to have left upper lobe lung nodule on imaging. - Had a PET CT scan on 03/06/2018 by Dr. Sherrie Sport which showed left upper lobe pulmonary nodule measuring 2.8 x 2.1 cm, no lymphadenopathy. - She underwent left VATS, left upper lobectomy and mediastinal lymph node dissection on 03/31/2018. - I discussed the pathology report with the patient in detail.  She has 3.9 cm tumor, poorly differentiated adenocarcinoma.  0 out of 12 lymph nodes were positive.  Visceral pleural invasion, lymphovascular invasion was not identified.  It was a unifocal tumor. - We reviewed the results of the MRI of the brain dated 05/16/2018 which did not show any  evidence of metastatic disease. - As her tumor was poorly differentiated, I have offered adjuvant chemotherapy with 4 cycles of cisplatin and pemetrexed. -We discussed the side effects of chemotherapy regimen in detail. -After some thinking, she decided not to do any chemotherapy.  She apparently does not have any benefits from her job and has to work.  She is also depressed since the diagnosis. -I have scheduled her for a 60-month follow-up with repeat CT scan. -I have prescribed her venlafaxine 37.5 mg daily for her depression.  We discussed about the side effects in detail.  2.  Rectal bleeding: -She complains of rectal bleeding when she goes to the bathroom which started 2 weeks prior to surgery. - She reportedly had a history of similar bleeding 4 years ago and underwent colonoscopy by Dr. Anthony Sar which showed hemorrhoids and 2 polyps removed. -Recent PET CT scan showed slightly increased uptake in the anus without a CT correlate, possibly nonspecific. -She does report occasional bleeding per rectum from her hemorrhoids. - She will see her surgeon later this month.  3.  Family history: -Mother had breast cancer.  Brother had colon cancer and 2 paternal aunts had colon cancer. -1 paternal cousin had leukemia. -She has an appointment to see our geneticist for further evaluation.      Orders placed this encounter:  Orders Placed This Encounter  Procedures  . CT Chest W Contrast  . CBC with Differential/Platelet  . Comprehensive metabolic panel  . CBC with Differential/Platelet  . Comprehensive metabolic panel      Derek Jack, MD [place] [phone]

## 2018-05-17 NOTE — Patient Instructions (Signed)
[facility] Discharge Instructions     Thank you for choosing [facility] at [facility] to provide your oncology and hematology care.  To afford each patient quality time with our provider, please arrive at least 15 minutes before your scheduled appointment time.   If you have a lab appointment with the Cancer Center please come in thru the  Main Entrance and check in at the main information desk  You need to re-schedule your appointment should you arrive 10 or more minutes late.  We strive to give you quality time with our providers, and arriving late affects you and other patients whose appointments are after yours.  Also, if you no show three or more times for appointments you may be dismissed from the clinic at the providers discretion.     Again, thank you for choosing [facility].  Our hope is that these requests will decrease the amount of time that you wait before being seen by our physicians.       _____________________________________________________________  Should you have questions after your visit to [facility], please contact our office at [phone] between the hours of 8:00 a.m. and 4:30 p.m.  Voicemails left after 4:00 p.m. will not be returned until the following business day.  For prescription refill requests, have your pharmacy contact our office and allow 72 hours.    Cancer Center Support Programs:   > Cancer Support Group  2nd Tuesday of the month 1pm-2pm, Journey Room    

## 2018-05-17 NOTE — Telephone Encounter (Signed)
Went to speak with pt in regards to financial assist. Pt stated she has decided not to do chemo at this time

## 2018-05-17 NOTE — Progress Notes (Signed)
Yolanda [name] I met with patient following MD appointment today. I gave her information on Alimta, Carboplatin and Cisplatin. These are the options provided to her for treatment. I gave her a basic overview of chemotherapy and what side effects to expect with each medication.  I provided my contact information and advised how I can help with her care.  Patient verbalizes her suffering with depression and how she doesn't want to pursue any treatment at this time. She wants to wait and do restaging scans and then she will decide if she wants systemic treatment at that time.  I advised the patient if she needs anything between no and then to not hesitate to give me a call.  She voices appreciation and understanding.

## 2018-05-23 ENCOUNTER — Encounter: Payer: Self-pay | Admitting: General Practice

## 2018-05-23 NOTE — Progress Notes (Signed)
[facility] CSW Progress Note  Patient has been connected w [facility] for psychosocial and financial support as appropriate.  Edwyna Shell, LCSW Clinical Social Worker Phone:  [phone]

## 2018-05-26 DIAGNOSIS — R030 Elevated blood-pressure reading, without diagnosis of hypertension: Secondary | ICD-10-CM | POA: Diagnosis not present

## 2018-05-26 DIAGNOSIS — M542 Cervicalgia: Secondary | ICD-10-CM | POA: Diagnosis not present

## 2018-05-26 DIAGNOSIS — M5416 Radiculopathy, lumbar region: Secondary | ICD-10-CM | POA: Diagnosis not present

## 2018-05-26 DIAGNOSIS — M5126 Other intervertebral disc displacement, lumbar region: Secondary | ICD-10-CM | POA: Diagnosis not present

## 2018-05-29 ENCOUNTER — Other Ambulatory Visit: Payer: Self-pay | Admitting: Thoracic Surgery (Cardiothoracic Vascular Surgery)

## 2018-05-29 DIAGNOSIS — C3492 Malignant neoplasm of unspecified part of left bronchus or lung: Secondary | ICD-10-CM

## 2018-05-30 ENCOUNTER — Ambulatory Visit: Payer: Self-pay | Admitting: Thoracic Surgery (Cardiothoracic Vascular Surgery)

## 2018-06-05 ENCOUNTER — Ambulatory Visit: Payer: Self-pay | Admitting: Thoracic Surgery (Cardiothoracic Vascular Surgery)

## 2018-06-06 ENCOUNTER — Encounter: Payer: Self-pay | Admitting: Thoracic Surgery (Cardiothoracic Vascular Surgery)

## 2018-06-06 ENCOUNTER — Encounter: Payer: Self-pay | Admitting: Genetic Counselor

## 2018-06-08 ENCOUNTER — Other Ambulatory Visit (HOSPITAL_COMMUNITY): Payer: BLUE CROSS/BLUE SHIELD

## 2018-06-08 ENCOUNTER — Encounter (HOSPITAL_COMMUNITY): Payer: BLUE CROSS/BLUE SHIELD | Admitting: Genetic Counselor

## 2018-06-27 ENCOUNTER — Ambulatory Visit: Payer: Self-pay | Admitting: Thoracic Surgery (Cardiothoracic Vascular Surgery)

## 2018-07-04 ENCOUNTER — Ambulatory Visit
Admission: RE | Admit: 2018-07-04 | Discharge: 2018-07-04 | Disposition: A | Discharge status: Home / Self Care | Payer: Self-pay | Source: Ambulatory Visit | Attending: Thoracic Surgery (Cardiothoracic Vascular Surgery) | Admitting: Thoracic Surgery (Cardiothoracic Vascular Surgery)

## 2018-07-04 ENCOUNTER — Ambulatory Visit: Payer: BLUE CROSS/BLUE SHIELD | Admitting: Thoracic Surgery (Cardiothoracic Vascular Surgery)

## 2018-07-04 ENCOUNTER — Encounter: Payer: Self-pay | Admitting: Thoracic Surgery (Cardiothoracic Vascular Surgery)

## 2018-07-04 VITALS — BP 140/78 | HR 88 | Resp 20 | Ht 65.0 in | Wt 149.0 lb

## 2018-07-04 DIAGNOSIS — C3492 Malignant neoplasm of unspecified part of left bronchus or lung: Secondary | ICD-10-CM

## 2018-07-04 DIAGNOSIS — Z902 Acquired absence of lung [part of]: Secondary | ICD-10-CM | POA: Diagnosis not present

## 2018-07-04 DIAGNOSIS — R079 Chest pain, unspecified: Secondary | ICD-10-CM | POA: Diagnosis not present

## 2018-07-04 NOTE — Progress Notes (Signed)
[address]       [place] [address]             [phone]     HPI: [name] returns for a scheduled follow-up visit   Yolanda [name] is a 53 year old woman with a history of tobacco abuse who had a thoracoscopic left upper lobectomy for a stage Ib adenocarcinoma on 03/31/2018.  She went home on day 3.  She went back to work about 4 weeks later.  She saw Dr. Delton Coombes in [place].  She decided not to have adjuvant chemotherapy.  She says that she gets out of breath with relatively little exertion.  She has some pain and paresthesias in her left breast.  She says this is more of a nuisance and she has been taking Percocet for that.  She has had an adverse reaction to gabapentin previously  Past Medical History:  Diagnosis Date  . Anxiety   . Chronic pain   . COPD (chronic obstructive pulmonary disease) ([place])   . Depression   . Heart murmur   . Hemorrhoids    bleed frequently  . History of kidney stones   . Mitral valve prolapse   . Pneumonia   . Seizures ([place] [place])   . Thyroid disease     Current Outpatient Medications  Medication Sig Dispense Refill  . acetaminophen (TYLENOL) 500 MG tablet Take 2 tablets (1,000 mg total) by mouth every 6 (six) hours. 30 tablet 0  . albuterol (PROVENTIL HFA;VENTOLIN HFA) 108 (90 Base) MCG/ACT inhaler Inhale 1-2 puffs into the lungs every 4 (four) hours as needed for wheezing or shortness of breath.    . levothyroxine (SYNTHROID, LEVOTHROID) 25 MCG tablet Take 25 mcg by mouth daily before breakfast.    . oxyCODONE-acetaminophen (PERCOCET) 10-325 MG tablet Take 1 tablet by mouth every 6 (six) hours as needed for pain.    Marland Kitchen tiZANidine (ZANAFLEX) 4 MG tablet Take 4 mg by mouth at bedtime.     Marland Kitchen venlafaxine XR (EFFEXOR-XR) 37.5 MG 24 hr capsule Take 1 capsule (37.5 mg total) by mouth at bedtime. 30 capsule 4  . vitamin C (ASCORBIC ACID) 500 MG tablet Take 500 mg by mouth every other day.     No current  facility-administered medications for this visit.     Physical Exam BP 140/78   Pulse 88   Resp 20   Ht 5' 5"  (1.651 m)   Wt 149 lb (67.6 kg)   LMP 03/30/2007   SpO2 98% Comment: RA  BMI 24.48 kg/m  53 year old woman in no acute distress Alert and oriented x3 with no focal deficits Lungs diminished at left base, otherwise clear Incisions well-healed  Diagnostic Tests: CHEST - 2 VIEW  COMPARISON:  04/25/2018  FINDINGS: Cardiac shadow is within normal limits. Postsurgical changes on the left are noted with mild volume loss and scarring in the left base. No focal infiltrate or sizable effusion is noted. No bony abnormality is seen.  IMPRESSION: Postsurgical changes on the left without acute abnormality.   Electronically Signed   By: Inez Catalina M.D.   On: 07/04/2018 10:16 I personally reviewed the chest x-ray images and concur with the findings noted above  Impression: Yolanda [name] is a 53 year old former smoker who underwent a thoracoscopic left upper lobectomy for a stage Ib adenocarcinoma in December 2019.  She is now about 3 months out from surgery and overall is doing well.  She opted not to have adjuvant chemotherapy.  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  Unfortunately she has had a severe adverse reaction to gabapentin in the past so that rules out using that.  She will continue with her current pain management regimen.  There are no restrictions on her activities but it will take her time to build back up her stamina.  Plan: Follow-up as scheduled any pain cancer center I will see her back in 9 months for a one-year follow-up from surgery.  [name], MD [facility] [phone]

## 2018-07-05 DIAGNOSIS — Z6824 Body mass index (BMI) 24.0-24.9, adult: Secondary | ICD-10-CM | POA: Diagnosis not present

## 2018-07-05 DIAGNOSIS — K51511 Left sided colitis with rectal bleeding: Secondary | ICD-10-CM | POA: Diagnosis not present

## 2018-07-05 DIAGNOSIS — E039 Hypothyroidism, unspecified: Secondary | ICD-10-CM | POA: Diagnosis not present

## 2018-07-11 ENCOUNTER — Encounter (INDEPENDENT_AMBULATORY_CARE_PROVIDER_SITE_OTHER): Payer: Self-pay | Admitting: Internal Medicine

## 2018-07-11 ENCOUNTER — Encounter (INDEPENDENT_AMBULATORY_CARE_PROVIDER_SITE_OTHER): Payer: Self-pay | Admitting: *Deleted

## 2018-07-11 ENCOUNTER — Other Ambulatory Visit: Payer: Self-pay

## 2018-07-11 ENCOUNTER — Ambulatory Visit (INDEPENDENT_AMBULATORY_CARE_PROVIDER_SITE_OTHER): Payer: BLUE CROSS/BLUE SHIELD | Admitting: Internal Medicine

## 2018-07-11 ENCOUNTER — Other Ambulatory Visit (INDEPENDENT_AMBULATORY_CARE_PROVIDER_SITE_OTHER): Payer: Self-pay | Admitting: Internal Medicine

## 2018-07-11 VITALS — BP 151/79 | HR 94 | Temp 98.1°F | Resp 18 | Ht 67.0 in | Wt 146.4 lb

## 2018-07-11 DIAGNOSIS — K625 Hemorrhage of anus and rectum: Secondary | ICD-10-CM | POA: Diagnosis not present

## 2018-07-11 NOTE — Progress Notes (Signed)
Reason for consultation  Rectal bleeding  History of present illness  Patient is 53 year old Caucasian [name] who is referred through courtesy of Dr. Sherrie Sport in for GI evaluation. Patient reports first onset of rectal bleeding in October 2019 which occurred with bowel movement and she noted small amount of bright red blood per rectum.  Lately she has been passing lot more blood with her bowel movement and she also has been passing mucus.  At times she just passes mucus.  She has had problems with constipation off and on but not to the point that she is take any laxatives.  Lately she has been having 2 formed bowel movements per day.  She has been passing blood daily.  She also reports postprandial lower abdominal cramping nausea and urgency with bowel movement.  She she had colonoscopy in 2015 by Dr.  Anthony Sar and was normal other than hemorrhoids. She was scheduled for colonoscopy with Dr. Arlester Marker in November 2019 but had to be postponed because of pulmonary issues and has not been able to reschedule. She has very good appetite.  She has gained 26 pounds since she quit cigarette smoking in October 2019.  She says she takes [place] powder occasionally.   Current Medications: Outpatient Encounter Medications as of 07/11/2018  Medication Sig  . acetaminophen (TYLENOL) 500 MG tablet Take 2 tablets (1,000 mg total) by mouth every 6 (six) hours.  Marland Kitchen albuterol (PROVENTIL HFA;VENTOLIN HFA) 108 (90 Base) MCG/ACT inhaler Inhale 1-2 puffs into the lungs every 4 (four) hours as needed for wheezing or shortness of breath.  . levothyroxine (SYNTHROID, LEVOTHROID) 25 MCG tablet Take 25 mcg by mouth daily before breakfast.  . oxyCODONE-acetaminophen (PERCOCET) 10-325 MG tablet Take 1 tablet by mouth every 6 (six) hours as needed for pain.  Marland Kitchen tiZANidine (ZANAFLEX) 4 MG tablet Take 4 mg by mouth at bedtime.   . vitamin C (ASCORBIC ACID) 500 MG tablet Take 500 mg by mouth every other day.  . [DISCONTINUED]  venlafaxine XR (EFFEXOR-XR) 37.5 MG 24 hr capsule Take 1 capsule (37.5 mg total) by mouth at bedtime. (Patient not taking: Reported on 07/11/2018)   No facility-administered encounter medications on file as of 07/11/2018.    Past medical history  COPD. Anxiety and depression. History of seizure disorder. History of kidney stones. Chronic low back pain.  She had lumbar spine surgery twice in 2015.  She takes pain medication daily.  Hypothyroidism was diagnosed in 2019.  She was hospitalized with pneumonia in October 2019 and treated with antibiotic but without symptomatic improvement.  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 Roxan Hockey on Yolanda/09/2017.  Allergies  Allergies  Allergen Reactions  . Coconut Oil Swelling    Anything coconut  . Codeine Rash    Family history  Father is 77 years old.  He has ulcerative colitis. Mother is 19 years old and doing well.  She was diagnosed with breast carcinoma when she was in her 68s. She has 2 brothers.  One brother 56 in good health another one has Crohn's disease. She has 2 sisters living.  One sister had surgery for pneumothorax.  She is doing fine at age 60.  Other sister is 52 and doing poorly because of alcohol abuse and liver problems.  She lost 1 sister at age 34.  She drowned.  Social history  She is married and has a daughter age 16 and son age 28 and they are in good health.  She smoked cigarettes for 35 years about a pack a day but quit in October 2019.  She does not drink alcohol.  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   Physical examination Blood pressure (!) 151/79, pulse 94, temperature 98.1 F (36.7 C), temperature source Oral, resp. rate 18, height 5' 7"  (1.702 m),  weight 146 lb 6.4 oz (66.4 kg), last menstrual period Yolanda/07/2006. Patient is alert and in no acute distress. Conjunctiva is pink. Sclera is nonicteric Oropharyngeal mucosa is normal. No neck masses or thyromegaly noted. Cardiac exam with regular rhythm normal S1 and S2. No murmur or gallop noted. Lungs are clear to auscultation. Abdomen is symmetrical.  Bowel sounds are normal.  On palpation abdomen is soft.  She has mild tenderness at LLQ and hypogastric region.  No organomegaly or masses. Rectal examination deferred. No LE edema or clubbing noted.  Labs/studies Results:  H&H was Yolanda.5 and 38.8 on Yolanda/07/2017  H&H was 10.5 and 33.3 on Yolanda/11/2017(post surgery).  Blood work from last week not available.  Assessment:  Patient is 53 year old Caucasian [name] who presents with 45-monthhistory of rectal bleeding which has become more frequent or almost occurring on daily basis associated with mucus per rectum and lower abdominal cramping.  Family history significant for ulcerative colitis in her father and she has a brother with Crohn's disease.  Last colonoscopy was in 2015 and was normal other than hemorrhoids.  Patient's symptomatology is concerning for inflammatory bowel disease.  One also has to worry about neoplastic process.  Recommendations:  Will request copy of recent blood work from Dr. HJoselyn Arrowoffice. Diagnostic colonoscopy under monitored anesthesia care in near future. Procedure risks reviewed with patient and her questions answered.  She is agreeable to proceed.

## 2018-07-11 NOTE — H&P (View-Only) (Signed)
Reason for consultation  Rectal bleeding  History of present illness  [name] who is referred through courtesy of Dr. Sherrie Sport in for GI evaluation. Patient reports first onset of rectal bleeding in October 2019 which occurred with bowel movement and she noted small amount of bright red blood per rectum.  Lately she has been passing lot more blood with her bowel movement and she also has been passing mucus.  At times she just passes mucus.  She has had problems with constipation off and on but not to the point that she is take any laxatives.  Lately she has been having 2 formed bowel movements per day.  She has been passing blood daily.  She also reports postprandial lower abdominal cramping nausea and urgency with bowel movement.  She she had colonoscopy in 2015 by Dr.  Anthony Sar and was normal other than hemorrhoids. She was scheduled for colonoscopy with Dr. Arlester Marker in November 2019 but had to be postponed because of pulmonary issues and has not been able to reschedule. She has very good appetite.  She has gained 26 pounds since she quit cigarette smoking in October 2019.  She says she takes [place] powder occasionally.   Current Medications: Outpatient Encounter Medications as of 07/11/2018  Medication Sig  . acetaminophen (TYLENOL) 500 MG tablet Take 2 tablets (1,000 mg total) by mouth every 6 (six) hours.  Marland Kitchen albuterol (PROVENTIL HFA;VENTOLIN HFA) 108 (90 Base) MCG/ACT inhaler Inhale 1-2 puffs into the lungs every 4 (four) hours as needed for wheezing or shortness of breath.  . levothyroxine (SYNTHROID, LEVOTHROID) 25 MCG tablet Take 25 mcg by mouth daily before breakfast.  . oxyCODONE-acetaminophen (PERCOCET) 10-325 MG tablet Take 1 tablet by mouth every 6 (six) hours as needed for pain.  Marland Kitchen tiZANidine (ZANAFLEX) 4 MG tablet Take 4 mg by mouth at bedtime.   . vitamin C (ASCORBIC ACID) 500 MG tablet Take 500 mg by mouth every other day.  . [DISCONTINUED]  venlafaxine XR (EFFEXOR-XR) 37.5 MG 24 hr capsule Take 1 capsule (37.5 mg total) by mouth at bedtime. (Patient not taking: Reported on 07/11/2018)   No facility-administered encounter medications on file as of 07/11/2018.    Past medical history  COPD. Anxiety and depression. History of seizure disorder. History of kidney stones. Chronic low back pain.  She had lumbar spine surgery twice in 2015.  She takes pain medication daily.  Hypothyroidism was diagnosed in 2019.  She was hospitalized with pneumonia in October 2019 and treated with antibiotic but without symptomatic improvement.  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 Roxan Hockey on 03/31/2018.  Allergies  Allergies  Allergen Reactions  . Coconut Oil Swelling    Anything coconut  . Codeine Rash    Family history  Father is 69 years old.  He has ulcerative colitis. Mother is 31 years old and doing well.  She was diagnosed with breast carcinoma when she was in her 76s. She has 2 brothers.  One brother 89 in good health another one has Crohn's disease. She has 2 sisters living.  One sister had surgery for pneumothorax.  She is doing fine at age 36.  Other sister is 17 and doing poorly because of alcohol abuse and liver problems.  She lost 1 sister at age 63.  She drowned.  Social history  She is married and has a daughter age 45 and son age 25 and they are in good health.  She smoked cigarettes for 35 years about a pack a day but quit in October 2019.  She does not drink alcohol.  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   Physical examination Blood pressure (!) 151/79, pulse 94, temperature 98.1 F (36.7 C), temperature source Oral, resp. rate 18, height 5' 7"  (1.702 m),  weight 146 lb 6.4 oz (66.4 kg), last menstrual period 03/30/2007. Patient is alert and in no acute distress. Conjunctiva is pink. Sclera is nonicteric Oropharyngeal mucosa is normal. No neck masses or thyromegaly noted. Cardiac exam with regular rhythm normal S1 and S2. No murmur or gallop noted. Lungs are clear to auscultation. Abdomen is symmetrical.  Bowel sounds are normal.  On palpation abdomen is soft.  She has mild tenderness at LLQ and hypogastric region.  No organomegaly or masses. Rectal examination deferred. No LE edema or clubbing noted.  Labs/studies Results:  H&H was 12.5 and 38.8 on 03/29/2018  H&H was 10.5 and 33.3 on 04/02/2018(post surgery).  Blood work from last week not available.  Assessment:  [name] who presents with 48-monthhistory of rectal bleeding which has become more frequent or almost occurring on daily basis associated with mucus per rectum and lower abdominal cramping.  Family history significant for ulcerative colitis in her father and she has a brother with Crohn's disease.  Last colonoscopy was in 2015 and was normal other than hemorrhoids.  Patient's symptomatology is concerning for inflammatory bowel disease.  One also has to worry about neoplastic process.  Recommendations:  Will request copy of recent blood work from Dr. HJoselyn Arrowoffice. Diagnostic colonoscopy under monitored anesthesia care in near future. Procedure risks reviewed with patient and her questions answered.  She is agreeable to proceed.

## 2018-07-11 NOTE — Patient Instructions (Signed)
Colonoscopy to be scheduled in near future.

## 2018-07-11 NOTE — Patient Instructions (Signed)
Yolanda [name]  07/11/2018     @PREFPERIOPPHARMACY @   Your procedure is scheduled on  07/14/2018  Report to [facility] at  1300  P.M. (1:00 PM)  Call this number if you have problems the morning of surgery:  [phone]   Remember:  Follow the diet and prep instructions given to you by Dr Olevia Perches office.                       Take these medicines the morning of surgery with A SIP OF WATER  Levothyroxine, oxycodone(if needed), Use your inhaler before you come.    Do not wear jewelry, make-up or nail polish.  Do not wear lotions, powders, or perfumes, or deodorant.  Do not shave 48 hours prior to surgery.  Men may shave face and neck.  Do not bring valuables to the hospital.  [facility] is not responsible for any belongings or valuables.  Contacts, dentures or bridgework may not be worn into surgery.  Leave your suitcase in the car.  After surgery it may be brought to your room.  For patients admitted to the hospital, discharge time will be determined by your treatment team.  Patients discharged the day of surgery will not be allowed to drive home.   Name and phone number of your driver:   family Special instructions:  None  Please read over the following fact sheets that you were given. Anesthesia Post-op Instructions and Care and Recovery After Surgery       Colonoscopy, Adult, Care After This sheet gives you information about how to care for yourself after your procedure. Your health care provider may also give you more specific instructions. If you have problems or questions, contact your health care provider. What can I expect after the procedure? After the procedure, it is common to have:  A small amount of blood in your stool for 24 hours after the procedure.  Some gas.  Mild abdominal cramping or bloating. Follow these instructions at home: General instructions  For the first 24 hours after the procedure: ? Do not drive or use machinery. ?  Do not sign important documents. ? Do not drink alcohol. ? Do your regular daily activities at a slower pace than normal. ? Eat soft, easy-to-digest foods.  Take over-the-counter or prescription medicines only as told by your health care provider. Relieving cramping and bloating   Try walking around when you have cramps or feel bloated.  Apply heat to your abdomen as told by your health care provider. Use a heat source that your health care provider recommends, such as a moist heat pack or a heating pad. ? Place a towel between your skin and the heat source. ? Leave the heat on for 20-30 minutes. ? Remove the heat if your skin turns bright red. This is especially important if you are unable to feel pain, heat, or cold. You may have a greater risk of getting burned. Eating and drinking   Drink enough fluid to keep your urine pale yellow.  Resume your normal diet as instructed by your health care provider. Avoid heavy or fried foods that are hard to digest.  Avoid drinking alcohol for as long as instructed by your health care provider. Contact a health care provider if:  You have blood in your stool 2-3 days after the procedure. Get help right away if:  You have more than a small spotting of blood in your  stool.  You pass large blood clots in your stool.  Your abdomen is swollen.  You have nausea or vomiting.  You have a fever.  You have increasing abdominal pain that is not relieved with medicine. Summary  After the procedure, it is common to have a small amount of blood in your stool. You may also have mild abdominal cramping and bloating.  For the first 24 hours after the procedure, do not drive or use machinery, sign important documents, or drink alcohol.  Contact your health care provider if you have a lot of blood in your stool, nausea or vomiting, a fever, or increased abdominal pain. This information is not intended to replace advice given to you by your health  care provider. Make sure you discuss any questions you have with your health care provider. Document Released: 11/25/2003 Document Revised: 02/02/2017 Document Reviewed: 06/24/2015 Elsevier Interactive Patient Education  2019 [place], Care After These instructions provide you with information about caring for yourself after your procedure. Your health care provider may also give you more specific instructions. Your treatment has been planned according to current medical practices, but problems sometimes occur. Call your health care provider if you have any problems or questions after your procedure. What can I expect after the procedure? After your procedure, you may:  Feel sleepy for several hours.  Feel clumsy and have poor balance for several hours.  Feel forgetful about what happened after the procedure.  Have poor judgment for several hours.  Feel nauseous or vomit.  Have a sore throat if you had a breathing tube during the procedure. Follow these instructions at home: For at least 24 hours after the procedure:      Have a responsible adult stay with you. It is important to have someone help care for you until you are awake and alert.  Rest as needed.  Do not: ? Participate in activities in which you could fall or become injured. ? Drive. ? Use heavy machinery. ? Drink alcohol. ? Take sleeping pills or medicines that cause drowsiness. ? Make important decisions or sign legal documents. ? Take care of children on your own. Eating and drinking  Follow the diet that is recommended by your health care provider.  If you vomit, drink water, juice, or soup when you can drink without vomiting.  Make sure you have little or no nausea before eating solid foods. General instructions  Take over-the-counter and prescription medicines only as told by your health care provider.  If you have sleep apnea, surgery and certain medicines can increase  your risk for breathing problems. Follow instructions from your health care provider about wearing your sleep device: ? Anytime you are sleeping, including during daytime naps. ? While taking prescription pain medicines, sleeping medicines, or medicines that make you drowsy.  If you smoke, do not smoke without supervision.  Keep all follow-up visits as told by your health care provider. This is important. Contact a health care provider if:  You keep feeling nauseous or you keep vomiting.  You feel light-headed.  You develop a rash.  You have a fever. Get help right away if:  You have trouble breathing. Summary  For several hours after your procedure, you may feel sleepy and have poor judgment.  Have a responsible adult stay with you for at least 24 hours or until you are awake and alert. This information is not intended to replace advice given to you by your health care provider.  Make sure you discuss any questions you have with your health care provider. Document Released: 08/03/2015 Document Revised: 11/26/2016 Document Reviewed: 08/03/2015 Elsevier Interactive Patient Education  2019 [employer].

## 2018-07-12 ENCOUNTER — Encounter (HOSPITAL_COMMUNITY): Payer: Self-pay

## 2018-07-12 ENCOUNTER — Encounter (HOSPITAL_COMMUNITY)
Admission: RE | Admit: 2018-07-12 | Discharge: 2018-07-12 | Disposition: A | Discharge status: Home / Self Care | Payer: BLUE CROSS/BLUE SHIELD | Source: Ambulatory Visit | Attending: Internal Medicine | Admitting: Internal Medicine

## 2018-07-12 ENCOUNTER — Encounter (INDEPENDENT_AMBULATORY_CARE_PROVIDER_SITE_OTHER): Payer: Self-pay | Admitting: Internal Medicine

## 2018-07-12 ENCOUNTER — Other Ambulatory Visit: Payer: Self-pay

## 2018-07-12 DIAGNOSIS — Z01812 Encounter for preprocedural laboratory examination: Secondary | ICD-10-CM | POA: Diagnosis not present

## 2018-07-12 HISTORY — DX: Malignant (primary) neoplasm, unspecified: C80.1

## 2018-07-12 LAB — CBC
HCT: 38.6 % (ref 36.0–46.0)
Hemoglobin: 12.9 g/dL (ref 12.0–15.0)
MCH: 31.2 pg (ref 26.0–34.0)
MCHC: 33.4 g/dL (ref 30.0–36.0)
MCV: 93.5 fL (ref 80.0–100.0)
Platelets: 257 10*3/uL (ref 150–400)
RBC: 4.13 MIL/uL (ref 3.87–5.11)
RDW: 12 % (ref 11.5–15.5)
WBC: 5.5 10*3/uL (ref 4.0–10.5)
nRBC: 0 % (ref 0.0–0.2)

## 2018-07-14 ENCOUNTER — Ambulatory Visit (HOSPITAL_COMMUNITY): Payer: BLUE CROSS/BLUE SHIELD | Admitting: Anesthesiology

## 2018-07-14 ENCOUNTER — Other Ambulatory Visit: Payer: Self-pay

## 2018-07-14 ENCOUNTER — Encounter (HOSPITAL_COMMUNITY)
Admission: RE | Disposition: A | Discharge status: Home / Self Care | Payer: Self-pay | Source: Home / Self Care | Attending: Internal Medicine

## 2018-07-14 ENCOUNTER — Ambulatory Visit (HOSPITAL_COMMUNITY)
Admission: RE | Admit: 2018-07-14 | Discharge: 2018-07-14 | Disposition: A | Discharge status: Home / Self Care | Payer: BLUE CROSS/BLUE SHIELD | Attending: Internal Medicine | Admitting: Internal Medicine

## 2018-07-14 DIAGNOSIS — Z87891 Personal history of nicotine dependence: Secondary | ICD-10-CM | POA: Diagnosis not present

## 2018-07-14 DIAGNOSIS — M545 Low back pain: Secondary | ICD-10-CM | POA: Insufficient documentation

## 2018-07-14 DIAGNOSIS — K529 Noninfective gastroenteritis and colitis, unspecified: Secondary | ICD-10-CM | POA: Diagnosis not present

## 2018-07-14 DIAGNOSIS — Z79890 Hormone replacement therapy (postmenopausal): Secondary | ICD-10-CM | POA: Diagnosis not present

## 2018-07-14 DIAGNOSIS — K51911 Ulcerative colitis, unspecified with rectal bleeding: Secondary | ICD-10-CM | POA: Diagnosis not present

## 2018-07-14 DIAGNOSIS — Z79899 Other long term (current) drug therapy: Secondary | ICD-10-CM | POA: Diagnosis not present

## 2018-07-14 DIAGNOSIS — K625 Hemorrhage of anus and rectum: Secondary | ICD-10-CM

## 2018-07-14 DIAGNOSIS — E039 Hypothyroidism, unspecified: Secondary | ICD-10-CM | POA: Insufficient documentation

## 2018-07-14 DIAGNOSIS — K621 Rectal polyp: Secondary | ICD-10-CM | POA: Diagnosis not present

## 2018-07-14 DIAGNOSIS — Z853 Personal history of malignant neoplasm of breast: Secondary | ICD-10-CM | POA: Diagnosis not present

## 2018-07-14 DIAGNOSIS — G8929 Other chronic pain: Secondary | ICD-10-CM | POA: Diagnosis not present

## 2018-07-14 DIAGNOSIS — J449 Chronic obstructive pulmonary disease, unspecified: Secondary | ICD-10-CM | POA: Insufficient documentation

## 2018-07-14 DIAGNOSIS — K51211 Ulcerative (chronic) proctitis with rectal bleeding: Secondary | ICD-10-CM | POA: Diagnosis not present

## 2018-07-14 DIAGNOSIS — Z8379 Family history of other diseases of the digestive system: Secondary | ICD-10-CM | POA: Insufficient documentation

## 2018-07-14 HISTORY — PX: BIOPSY: SHX5522

## 2018-07-14 HISTORY — PX: POLYPECTOMY: SHX5525

## 2018-07-14 HISTORY — PX: COLONOSCOPY WITH PROPOFOL: SHX5780

## 2018-07-14 SURGERY — COLONOSCOPY WITH PROPOFOL
Anesthesia: General

## 2018-07-14 MED ORDER — PROPOFOL 10 MG/ML IV BOLUS
INTRAVENOUS | Status: AC
  Filled 2018-07-14: qty 20

## 2018-07-14 MED ORDER — LACTATED RINGERS IV SOLN
INTRAVENOUS | Status: DC
  Administered 2018-07-14: 07:00:00 via INTRAVENOUS

## 2018-07-14 MED ORDER — MIDAZOLAM HCL 2 MG/2ML IJ SOLN: 0.5000 mg | Freq: Once | INTRAMUSCULAR | Status: DC | PRN

## 2018-07-14 MED ORDER — PROPOFOL 10 MG/ML IV BOLUS
INTRAVENOUS | Status: AC
  Filled 2018-07-14: qty 60

## 2018-07-14 MED ORDER — PROPOFOL 10 MG/ML IV BOLUS
INTRAVENOUS | Status: AC
  Filled 2018-07-14: qty 40

## 2018-07-14 MED ORDER — PROPOFOL 500 MG/50ML IV EMUL
INTRAVENOUS | Status: DC | PRN
  Administered 2018-07-14: 125 ug/kg/min via INTRAVENOUS
  Administered 2018-07-14: 200 ug/kg/min via INTRAVENOUS

## 2018-07-14 MED ORDER — MESALAMINE 1000 MG RE SUPP: 1000.0000 mg | Freq: Every day | RECTAL | 25 refills | Status: DC

## 2018-07-14 MED ORDER — PROPOFOL 10 MG/ML IV BOLUS
INTRAVENOUS | Status: DC | PRN
  Administered 2018-07-14: 20 mg via INTRAVENOUS
  Administered 2018-07-14: 60 mg via INTRAVENOUS
  Administered 2018-07-14: 20 mg via INTRAVENOUS
  Administered 2018-07-14: 40 mg via INTRAVENOUS
  Administered 2018-07-14 (×2): 20 mg via INTRAVENOUS

## 2018-07-14 MED ORDER — PROMETHAZINE HCL 25 MG/ML IJ SOLN: 6.2500 mg | INTRAMUSCULAR | Status: DC | PRN

## 2018-07-14 NOTE — Discharge Instructions (Signed)
No aspirin or NSAIDs for 24 hours. [place] suppository 1 g per rectum daily at bedtime. Resume usual medications and diet as before. No driving for 24 hours. Physician will call with biopsy results.   Colonoscopy, Adult, Care After This sheet gives you information about how to care for yourself after your procedure. Your doctor may also give you more specific instructions. If you have problems or questions, call your doctor. What can I expect after the procedure? After the procedure, it is common to have:  A small amount of blood in your poop for 24 hours.  Some gas.  Mild cramping or bloating in your belly. Follow these instructions at home: General instructions  For the first 24 hours after the procedure: ? Do not drive or use machinery. ? Do not sign important documents. ? Do not drink alcohol. ? Do your daily activities more slowly than normal. ? Eat foods that are soft and easy to digest.  Take over-the-counter or prescription medicines only as told by your doctor. To help cramping and bloating:   Try walking around.  Put heat on your belly (abdomen) as told by your doctor. Use a heat source that your doctor recommends, such as a moist heat pack or a heating pad. ? Put a towel between your skin and the heat source. ? Leave the heat on for 20-30 minutes. ? Remove the heat if your skin turns bright red. This is especially important if you cannot feel pain, heat, or cold. You can get burned. Eating and drinking   Drink enough fluid to keep your pee (urine) clear or pale yellow.  Return to your normal diet as told by your doctor. Avoid heavy or fried foods that are hard to digest.  Avoid drinking alcohol for as long as told by your doctor. Contact a doctor if:  You have blood in your poop (stool) 2-3 days after the procedure. Get help right away if:  You have more than a small amount of blood in your poop.  You see large clumps of tissue (blood clots) in your  poop.  Your belly is swollen.  You feel sick to your stomach (nauseous).  You throw up (vomit).  You have a fever.  You have belly pain that gets worse, and medicine does not help your pain. Summary  After the procedure, it is common to have a small amount of blood in your poop. You may also have mild cramping and bloating in your belly.  For the first 24 hours after the procedure, do not drive or use machinery, do not sign important documents, and do not drink alcohol.  Get help right away if you have a lot of blood in your poop, feel sick to your stomach, have a fever, or have more belly pain. This information is not intended to replace advice given to you by your health care provider. Make sure you discuss any questions you have with your health care provider. Document Released: 05/15/2010 Document Revised: 02/10/2017 Document Reviewed: 01/05/2016 Elsevier Interactive Patient Education  2019 [place], Care After These instructions provide you with information about caring for yourself after your procedure. Your health care provider may also give you more specific instructions. Your treatment has been planned according to current medical practices, but problems sometimes occur. Call your health care provider if you have any problems or questions after your procedure. What can I expect after the procedure? After your procedure, you may:  Feel sleepy for several  hours.  Feel clumsy and have poor balance for several hours.  Feel forgetful about what happened after the procedure.  Have poor judgment for several hours.  Feel nauseous or vomit.  Have a sore throat if you had a breathing tube during the procedure. Follow these instructions at home: For at least 24 hours after the procedure:      Have a responsible adult stay with you. It is important to have someone help care for you until you are awake and alert.  Rest as needed.  Do  not: ? Participate in activities in which you could fall or become injured. ? Drive. ? Use heavy machinery. ? Drink alcohol. ? Take sleeping pills or medicines that cause drowsiness. ? Make important decisions or sign legal documents. ? Take care of children on your own. Eating and drinking  Follow the diet that is recommended by your health care provider.  If you vomit, drink water, juice, or soup when you can drink without vomiting.  Make sure you have little or no nausea before eating solid foods. General instructions  Take over-the-counter and prescription medicines only as told by your health care provider.  If you have sleep apnea, surgery and certain medicines can increase your risk for breathing problems. Follow instructions from your health care provider about wearing your sleep device: ? Anytime you are sleeping, including during daytime naps. ? While taking prescription pain medicines, sleeping medicines, or medicines that make you drowsy.  If you smoke, do not smoke without supervision.  Keep all follow-up visits as told by your health care provider. This is important. Contact a health care provider if:  You keep feeling nauseous or you keep vomiting.  You feel light-headed.  You develop a rash.  You have a fever. Get help right away if:  You have trouble breathing. Summary  For several hours after your procedure, you may feel sleepy and have poor judgment.  Have a responsible adult stay with you for at least 24 hours or until you are awake and alert. This information is not intended to replace advice given to you by your health care provider. Make sure you discuss any questions you have with your health care provider. Document Released: 08/03/2015 Document Revised: 11/26/2016 Document Reviewed: 08/03/2015 Elsevier Interactive Patient Education  2019 [employer].

## 2018-07-14 NOTE — Interval H&P Note (Signed)
Patient's symptoms have not changed since she was last seen in the office earlier this week.  She did notice rectal bleeding yesterday when she took the prep.  She denies abdominal pain. Her hemoglobin is up to 12.9. Examination is unchanged. History and Physical Interval Note:  07/14/2018 7:18 AM  Yolanda [name]  has presented today for surgery, with the diagnosis of rectal bleeding.  The various methods of treatment have been discussed with the patient and family. After consideration of risks, benefits and other options for treatment, the patient has consented to  Procedure(s) with comments: COLONOSCOPY WITH PROPOFOL (N/A) - 2:20 as a surgical intervention.  The patient's history has been reviewed, patient examined, no change in status, stable for surgery.  I have reviewed the patient's chart and labs.  Questions were answered to the patient's satisfaction.     [employer]

## 2018-07-14 NOTE — Op Note (Signed)
[facility] Patient Name: Yolanda [name] Procedure Date: 07/14/2018 7:37 AM MRN: [id number] Date of Birth: [birth year] Attending MD: Hildred Laser , MD CSN: [id number] Age: 53 Admit Type: Outpatient Procedure:                Colonoscopy Indications:              Rectal bleeding Providers:                Hildred Laser, MD, Jeanann Lewandowsky. Sharon Seller, RN, Aram Candela Referring MD:             Stoney Bang, MD Medicines:                Propofol per Anesthesia Complications:            No immediate complications. Estimated Blood Loss:     Estimated blood loss was minimal. Procedure:                Pre-Anesthesia Assessment:                           - Prior to the procedure, a History and Physical                            was performed, and patient medications and                            allergies were reviewed. The patient's tolerance of                            previous anesthesia was also reviewed. The risks                            and benefits of the procedure and the sedation                            options and risks were discussed with the patient.                            All questions were answered, and informed consent                            was obtained. Prior Anticoagulants: The patient has                            taken no previous anticoagulant or antiplatelet                            agents. ASA Grade Assessment: II - A patient with                            mild systemic disease. After reviewing the risks  and benefits, the patient was deemed in                            satisfactory condition to undergo the procedure.                           After obtaining informed consent, the colonoscope                            was passed under direct vision. Throughout the                            procedure, the patient's blood pressure, pulse, and                            oxygen saturations were  monitored continuously. The                            [id number]-[id number] ([id number]) scope was introduced through                            the anus and advanced to the the terminal ileum,                            with identification of the appendiceal orifice and                            IC valve. The colonoscopy was technically difficult                            and complex due to significant looping at sigmoid                            colon. Successful completion of the procedure was                            aided by using manual pressure and withdrawing and                            reinserting the scope. The patient tolerated the                            procedure well. The quality of the bowel                            preparation was fair. The terminal ileum, ileocecal                            valve, appendiceal orifice, and rectum were                            photographed. Scope In: 7:38:58 AM Scope Out: 8:07:27 AM Scope Withdrawal Time: 0 hours 8 minutes 57 seconds  Total Procedure Duration: 0  hours 28 minutes 29 seconds  Findings:      The perianal and digital rectal examinations were normal.      The terminal ileum appeared normal.      The recto-sigmoid colon, sigmoid colon, descending colon, splenic       flexure, transverse colon, hepatic flexure, ascending colon, cecum,       appendiceal orifice and ileocecal valve appeared normal.      Inflammation was found in a continuous and circumferential pattern in       the rectum. This was graded as Mayo Score 3 (severe, with spontaneous       bleeding, ulcerations), and when compared to the previous examination,       the findings are new. Biopsies were taken with a cold forceps for       histology. The pathology specimen was placed into Bottle Number 1.      A small polyp was found in the rectum. The polyp was sessile. The polyp       was removed with a cold snare. Resection and retrieval were complete.       The  pathology specimen was placed into Bottle Number 2. Impression:               - Preparation of the colon was fair.                           - The examined portion of the ileum was normal.                           - The recto-sigmoid colon, sigmoid colon,                            descending colon, splenic flexure, transverse                            colon, hepatic flexure, ascending colon, cecum,                            appendiceal orifice and ileocecal valve are normal.                           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 Biopsied.                           - One small polyp in the rectum, removed with a                            cold snare. Resected and retrieved. Moderate Sedation:      Moderate (conscious) sedation was administered by the endoscopy nurse       and supervised by the endoscopist. The following parameters were       monitored: oxygen saturation, heart rate, blood pressure, CO2       capnography and response to care. Recommendation:           - Patient has a contact number  available for                            emergencies. The signs and symptoms of potential                            delayed complications were discussed with the                            patient. Return to normal activities tomorrow.                            Written discharge instructions were provided to the                            patient.                           - Resume previous diet today.                           - Continue present medications.                           - No aspirin, ibuprofen, naproxen, or other                            non-steroidal anti-inflammatory drugs.                           - Canasa supp 1 g pr qhs.                           - Await pathology results. Procedure Code(s):        --- Professional ---                           [phone], Colonoscopy, flexible; with  removal of                            tumor(s), polyp(s), or other lesion(s) by snare                            technique                           45380, 71, Colonoscopy, flexible; with biopsy,                            single or multiple Diagnosis Code(s):        --- Professional ---                           X91.478, Ulcerative (chronic) proctitis with rectal                            bleeding  K62.1, Rectal polyp                           K62.5, Hemorrhage of anus and rectum CPT copyright 2018 American [employer] Association. All rights reserved. The codes documented in this report are preliminary and upon coder review may  be revised to meet current compliance requirements. Hildred Laser, MD Hildred Laser, MD 07/14/2018 8:20:17 AM This report has been signed electronically. Number of Addenda: 0

## 2018-07-14 NOTE — Anesthesia Postprocedure Evaluation (Signed)
Anesthesia Post Note  Patient: Yolanda [name]  Procedure(s) Performed: COLONOSCOPY WITH PROPOFOL (N/A ) BIOPSY POLYPECTOMY  Patient location during evaluation: PACU Anesthesia Type: General Level of consciousness: awake and alert and patient cooperative Pain management: satisfactory to patient Vital Signs Assessment: post-procedure vital signs reviewed and stable Respiratory status: spontaneous breathing Cardiovascular status: stable Postop Assessment: no apparent nausea or vomiting Anesthetic complications: no     Last Vitals:  Vitals:   07/14/18 0830 07/14/18 0845  BP: 127/70 95/71  Pulse: 75 73  Resp: 16 20  Temp:  36.5 C  SpO2:      Last Pain:  Vitals:   07/14/18 0830  PainSc: 0-No pain                 Teletha Petrea

## 2018-07-14 NOTE — Anesthesia Procedure Notes (Signed)
Procedure Name: MAC Date/Time: 07/14/2018 7:32 AM Performed by: Vista Deck, CRNA Pre-anesthesia Checklist: Patient identified, Emergency Drugs available, Suction available, Timeout performed and Patient being monitored Patient Re-evaluated:Patient Re-evaluated prior to induction Oxygen Delivery Method: Nasal Cannula

## 2018-07-14 NOTE — Anesthesia Preprocedure Evaluation (Signed)
Anesthesia Evaluation  Patient identified by MRN, date of birth, ID band Patient awake    Reviewed: Allergy & Precautions, NPO status , Patient's Chart, lab work & pertinent test results  Airway Mallampati: I  TM Distance: >3 FB Neck ROM: Full    Dental no notable dental hx. (+) Teeth Intact   Pulmonary pneumonia, resolved, COPD,  COPD inhaler, former smoker,  s/p recent L lung resection of tumor    Pulmonary exam normal breath sounds clear to auscultation       Cardiovascular Exercise Tolerance: Good negative cardio ROS Normal cardiovascular examI Rhythm:Regular Rate:Normal  Reports MVP without limitations   Neuro/Psych Seizures -, Well Controlled,  Anxiety Depression negative psych ROS   GI/Hepatic negative GI ROS, Neg liver ROS,   Endo/Other  negative endocrine ROS  Renal/GU negative Renal ROS  negative genitourinary   Musculoskeletal negative musculoskeletal ROS (+)   Abdominal   Peds negative pediatric ROS (+)  Hematology negative hematology ROS (+)   Anesthesia Other Findings   Reproductive/Obstetrics negative OB ROS                             Anesthesia Physical Anesthesia Plan  ASA: III  Anesthesia Plan: General   Post-op Pain Management:    Induction: Intravenous  PONV Risk Score and Plan:   Airway Management Planned: Nasal Cannula and Simple Face Mask  Additional Equipment:   Intra-op Plan:   Post-operative Plan:   Informed Consent: I have reviewed the patients History and Physical, chart, labs and discussed the procedure including the risks, benefits and alternatives for the proposed anesthesia with the patient or authorized representative who has indicated his/her understanding and acceptance.     Dental advisory given  Plan Discussed with: CRNA  Anesthesia Plan Comments:         Anesthesia Quick Evaluation

## 2018-07-14 NOTE — Transfer of Care (Signed)
Immediate Anesthesia Transfer of Care Note  Patient: Yolanda [name]  Procedure(s) Performed: COLONOSCOPY WITH PROPOFOL (N/A ) BIOPSY POLYPECTOMY  Patient Location: PACU  Anesthesia Type:General  Level of Consciousness: awake, alert  and patient cooperative  Airway & Oxygen Therapy: Patient Spontanous Breathing  Post-op Assessment: Report given to RN and Post -op Vital signs reviewed and stable  Post vital signs: Reviewed and stable  Last Vitals:  Vitals Value Taken Time  BP    Temp    Pulse 83 07/14/2018  8:15 AM  Resp    SpO2 100 % 07/14/2018  8:15 AM  Vitals shown include unvalidated device data.  Last Pain:  Vitals:   07/14/18 0734  PainSc: 0-No pain         Complications: No apparent anesthesia complications

## 2018-07-17 ENCOUNTER — Encounter (HOSPITAL_COMMUNITY): Payer: Self-pay | Admitting: Internal Medicine

## 2018-07-18 ENCOUNTER — Other Ambulatory Visit (INDEPENDENT_AMBULATORY_CARE_PROVIDER_SITE_OTHER): Payer: Self-pay | Admitting: Internal Medicine

## 2018-08-08 DIAGNOSIS — M5416 Radiculopathy, lumbar region: Secondary | ICD-10-CM | POA: Diagnosis not present

## 2018-08-08 DIAGNOSIS — M5137 Other intervertebral disc degeneration, lumbosacral region: Secondary | ICD-10-CM | POA: Diagnosis not present

## 2018-09-08 ENCOUNTER — Ambulatory Visit (HOSPITAL_COMMUNITY): Admission: RE | Admit: 2018-09-08 | Payer: BLUE CROSS/BLUE SHIELD | Source: Ambulatory Visit

## 2018-09-08 ENCOUNTER — Other Ambulatory Visit (HOSPITAL_COMMUNITY): Payer: BLUE CROSS/BLUE SHIELD

## 2018-09-12 ENCOUNTER — Ambulatory Visit (HOSPITAL_COMMUNITY): Payer: BLUE CROSS/BLUE SHIELD | Admitting: Hematology

## 2018-10-26 ENCOUNTER — Encounter (INDEPENDENT_AMBULATORY_CARE_PROVIDER_SITE_OTHER): Payer: Self-pay | Admitting: Internal Medicine

## 2018-10-26 ENCOUNTER — Ambulatory Visit (INDEPENDENT_AMBULATORY_CARE_PROVIDER_SITE_OTHER): Payer: BC Managed Care – PPO | Admitting: Internal Medicine

## 2018-10-26 ENCOUNTER — Other Ambulatory Visit: Payer: Self-pay

## 2018-10-26 VITALS — BP 146/87 | HR 80 | Temp 98.1°F | Ht 67.0 in | Wt 148.1 lb

## 2018-10-26 DIAGNOSIS — K5289 Other specified noninfective gastroenteritis and colitis: Secondary | ICD-10-CM

## 2018-10-26 LAB — CBC WITH DIFFERENTIAL/PLATELET
Absolute Monocytes: 498 cells/uL (ref 200–950)
Basophils Absolute: 21 cells/uL (ref 0–200)
Basophils Relative: 0.4 %
Eosinophils Absolute: 111 cells/uL (ref 15–500)
Eosinophils Relative: 2.1 %
HCT: 39.8 % (ref 35.0–45.0)
Hemoglobin: 13.2 g/dL (ref 11.7–15.5)
Lymphs Abs: 1585 cells/uL (ref 850–3900)
MCH: 31 pg (ref 27.0–33.0)
MCHC: 33.2 g/dL (ref 32.0–36.0)
MCV: 93.4 fL (ref 80.0–100.0)
MPV: 10 fL (ref 7.5–12.5)
Monocytes Relative: 9.4 %
Neutro Abs: 3085 cells/uL (ref 1500–7800)
Neutrophils Relative %: 58.2 %
Platelets: 329 10*3/uL (ref 140–400)
RBC: 4.26 10*6/uL (ref 3.80–5.10)
RDW: 12.9 % (ref 11.0–15.0)
Total Lymphocyte: 29.9 %
WBC: 5.3 10*3/uL (ref 3.8–10.8)

## 2018-10-26 MED ORDER — HYDROCORTISONE 100 MG/60ML RE ENEM: 1.0000 | ENEMA | Freq: Every day | RECTAL | 0 refills | Status: DC

## 2018-10-26 NOTE — Progress Notes (Signed)
Subjective:    Patient ID: Yolanda [name], female    DOB: [birth year], 53 y.o.   MRN: [id number]  HPI  Here today for f/u. Underwent a colonoscopy in March of this year (rectal bleeding).  Impression:               - Preparation of the colon was fair.                           - The examined portion of the ileum was normal.                           - The recto-sigmoid colon, sigmoid colon,                            descending colon, splenic flexure, transverse                            colon, hepatic flexure, ascending colon, cecum,                            appendiceal orifice and ileocecal valve are normal.                           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 Biopsied.                           - One small polyp in the rectum, removed with a                            cold snare. Resected and retrieved. Biopsy: Active colitis. No evidence of chronic inflammation bowel disease, dysplasia or malignancy.  She was given an Rx for Canasa supp. She did finish the suppositories. She says she is still having a lot of rectal blood and mucous. She says every time she eats, she will have a BM. She is having 4 BMs a day.  There has been no weight loss. She says she has gained 2 pounds.  Review of Systems     Past Medical History:  Diagnosis Date  . Anxiety   . Cancer ([place])   . Chronic pain   . COPD (chronic obstructive pulmonary disease) ([place])   . Depression   . Heart murmur   . Hemorrhoids    bleed frequently  . History of kidney stones   . Mitral valve prolapse   . Pneumonia   . Seizures ([place])   . Thyroid disease     Past Surgical History:  Procedure Laterality Date  . BACK SURGERY    . BIOPSY  07/14/2018   Procedure: BIOPSY;  Surgeon: Rogene Houston, MD;  [facility];  Service: Endoscopy;;  rectum  . COLONOSCOPY  2015   [facility]  .  COLONOSCOPY WITH PROPOFOL N/A 07/14/2018   Procedure: COLONOSCOPY WITH PROPOFOL;  Surgeon: Rogene Houston, MD;  [facility];  Service: Endoscopy;  Laterality: N/A;  2:20  . POLYPECTOMY  07/14/2018   Procedure: POLYPECTOMY;  Surgeon: Rogene Houston, MD;  [facility];  Service: Endoscopy;;  rectum  . VIDEO ASSISTED THORACOSCOPY (VATS)/ LOBECTOMY Left 03/31/2018   Procedure: VIDEO ASSISTED THORACOSCOPY (VATS)/LEFT UPPER LOBECTOMY;  Surgeon: [name], MD;  Location: [facility];  Service: Thoracic;  Laterality: Left;  . WISDOM TOOTH EXTRACTION      Allergies  Allergen Reactions  . Coconut Oil Swelling    Anything coconut  . Codeine Rash    Current Outpatient Medications on File Prior to Visit  Medication Sig Dispense Refill  . acetaminophen (TYLENOL) 500 MG tablet Take 2 tablets (1,000 mg total) by mouth every 6 (six) hours. 30 tablet 0  . albuterol (PROVENTIL HFA;VENTOLIN HFA) 108 (90 Base) MCG/ACT inhaler Inhale 1-2 puffs into the lungs every 4 (four) hours as needed for wheezing or shortness of breath.    . levothyroxine (SYNTHROID, LEVOTHROID) 25 MCG tablet Take 25 mcg by mouth daily before breakfast.    . mesalamine (CANASA) 1000 MG suppository Place 1 suppository (1,000 mg total) rectally at bedtime. 30 suppository 25  . oxyCODONE-acetaminophen (PERCOCET) 10-325 MG tablet Take 1 tablet by mouth every 6 (six) hours as needed for pain.    Marland Kitchen tiZANidine (ZANAFLEX) 4 MG tablet Take 4 mg by mouth every 8 (eight) hours as needed for muscle spasms.     . vitamin C (ASCORBIC ACID) 500 MG tablet Take 500 mg by mouth daily.      No current facility-administered medications on file prior to visit.      Objective:   Physical Exam Blood pressure (!) 146/87, pulse 80, temperature 98.1 F (36.7 C), height 5' 7"  (1.702 m), weight 148 lb 1.6 oz (67.2 kg), last menstrual period 03/30/2007.  Alert and oriented. Skin warm and dry. Oral mucosa is moist.   . Sclera  anicteric, conjunctivae is pink. Thyroid not enlarged. No cervical lymphadenopathy. Lungs clear. Heart regular rate and rhythm.  Abdomen is soft. Bowel sounds are positive. No hepatomegaly. No abdominal masses felt. No tenderness.  No edema to lower extremities.         Assessment & Plan:  Colitis. She continues to have symptoms: rectal bleeding and mucous. Am going to call in Hydrocortisone enemas x 2 weeks. CBC and sedrate. Will discuss with Dr. Laural Golden.

## 2018-10-26 NOTE — Patient Instructions (Signed)
Labs today

## 2018-10-31 ENCOUNTER — Other Ambulatory Visit (INDEPENDENT_AMBULATORY_CARE_PROVIDER_SITE_OTHER): Payer: Self-pay | Admitting: Internal Medicine

## 2018-10-31 DIAGNOSIS — K625 Hemorrhage of anus and rectum: Secondary | ICD-10-CM

## 2018-10-31 MED ORDER — DICYCLOMINE HCL 10 MG PO CAPS: 10.0000 mg | ORAL_CAPSULE | Freq: Three times a day (TID) | ORAL | 3 refills | Status: DC

## 2018-10-31 NOTE — Progress Notes (Signed)
dicy

## 2018-11-07 DIAGNOSIS — M961 Postlaminectomy syndrome, not elsewhere classified: Secondary | ICD-10-CM | POA: Diagnosis not present

## 2018-11-07 DIAGNOSIS — M5416 Radiculopathy, lumbar region: Secondary | ICD-10-CM | POA: Diagnosis not present

## 2018-11-07 DIAGNOSIS — R030 Elevated blood-pressure reading, without diagnosis of hypertension: Secondary | ICD-10-CM | POA: Diagnosis not present

## 2018-11-08 ENCOUNTER — Other Ambulatory Visit (HOSPITAL_COMMUNITY): Payer: BLUE CROSS/BLUE SHIELD

## 2018-11-12 DIAGNOSIS — Z209 Contact with and (suspected) exposure to unspecified communicable disease: Secondary | ICD-10-CM | POA: Diagnosis not present

## 2018-11-12 DIAGNOSIS — R069 Unspecified abnormalities of breathing: Secondary | ICD-10-CM | POA: Diagnosis not present

## 2018-11-12 DIAGNOSIS — R0602 Shortness of breath: Secondary | ICD-10-CM | POA: Diagnosis not present

## 2018-11-15 ENCOUNTER — Ambulatory Visit (HOSPITAL_COMMUNITY): Payer: BLUE CROSS/BLUE SHIELD | Admitting: Hematology

## 2018-11-21 ENCOUNTER — Telehealth (INDEPENDENT_AMBULATORY_CARE_PROVIDER_SITE_OTHER): Payer: Self-pay | Admitting: Internal Medicine

## 2018-11-21 NOTE — Telephone Encounter (Signed)
Dr. Laural Golden. Callie states she is still bleeding. Could u take care of this. She finished the [employer].

## 2018-11-23 NOTE — Telephone Encounter (Signed)
Try to reach patient on her cell phone as well as home phone but no answer. We will try again tomorrow.

## 2018-11-28 DIAGNOSIS — K921 Melena: Secondary | ICD-10-CM | POA: Diagnosis not present

## 2018-11-28 DIAGNOSIS — Z6824 Body mass index (BMI) 24.0-24.9, adult: Secondary | ICD-10-CM | POA: Diagnosis not present

## 2018-11-28 DIAGNOSIS — K581 Irritable bowel syndrome with constipation: Secondary | ICD-10-CM | POA: Diagnosis not present

## 2018-12-03 NOTE — Progress Notes (Signed)
Subjective:    Patient ID: Yolanda [name], female    DOB: 1965/09/03, 53 y.o.   MRN: [id number]  HPI  Yolanda [name] is a 53 year old female with a past medical history significant for anxiety, depression, chronic pain, seizure disorder, hypothyroidism, MVP and kidney stones. She developed SOB and she was diagnosed with pneumonia treated with antibiotics. However, her symptoms did not improve therefore a chest CT was done which identified a left upper lung mass 1.4 x 2.1 cm. Biopsies confirmed adenocarcinoma stage Ib. She is s/p left VATS with wedge resection of left upper nodule, thoracoscopic left upper lobectomy and mediastinal lymph node dissection by Dr. Roxan Hockey on 03/31/2018.  She presents today for further follow up regarding blood and mucous per the rectum. She initially developed rectal bleeding 02/2018. A colonoscopy was scheduled with Dr. Anthony Sar but was canceled due to her diagnosis of lung cancer and required surgery as documented above.  She eventually underwent a colonoscopy by Dr. Laural Golden on 07/14/2018 which showed severe ulcerative proctitis, normal colon and TI. The rectal biopsies showed active colitis without evidence of IBD or dysplasia. She was prescribed Canasa 1gm suppositories which she took for 2 weeks. Her rectal bleeding and mucous production persisted so she was then prescribed Hydrocortisone enemas which she took for 2 weeks in July. She felt "sick" when taking the Hydrocortisone enemas, reported having severe generalized abdominal pain, with foul odor explosion of watery stool and bloody mucous. She is now constipated. She is passing hard balls of stool a few times daily. She saw her PCP who prescribed Linzess which she has taken for the past weeks. She feels less constipated but continues to pass moderate amounts of red blood and bloody mucous from the rectum. She has tenesmus at times. She has intermittent anorectal pain. No recent antibiotics. She took Aleve 220m  one tab QD x 2 weeks one month ago, now using Tylenol as needed for aches and pains. No abdominal pain at this time. Father with history of UC and brother with Crohn's disease.  She reported having a colonoscopy by Dr. DAnthony Sarin 2008, however, chart review notes indicate she had a colonoscopy in 2015 by Dr. DAnthony Sar no colonoscopy reports for these dates found in chart.  Labs 10/26/2018: WBC 5.3. Hg 13.2. HCT 39.8.PLT 329.   Colonoscopy by Dr. RLaural Golden3/20/2020: Preparation of the colon was fair. - The examined portion of the ileum was normal. - The recto-sigmoid colon, sigmoid colon, descending colon, splenic flexure, transverse colon, hepatic flexure, ascending colon, cecum, appendiceal orifice and ileocecal valve are normal. 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 - One small polyp in the rectum, removed with a cold snare.   Rectum Biopsy results: ACTIVE COLITIS. - THERE IS NO EVIDENCE OF CHRONIC INFLAMMATORY BOWEL DISEASE, DYSPLASIA OR MALIGNANCY. - SEE COMMENT. 2. Rectum, polyp(s) - BENIGN POLYPOID COLORECTAL MUCOSA WITH LYMPHOID AGGREGATES. - THERE IS NO EVIDENCE OF MALIGNANCY.  Past Medical History:  Diagnosis Date   Anxiety    [place]    Chronic pain    COPD (chronic obstructive pulmonary disease) ([facility])    Depression    Heart murmur    Hemorrhoids    bleed frequently   History of kidney stones    Mitral valve prolapse    Pneumonia    [place]izures [place]    Thyroid disease    Past Surgical History:  Procedure Laterality Date   BACK SURGERY  BIOPSY  07/14/2018   Procedure: BIOPSY;  Surgeon: Rogene Houston, MD;  [facility];  Service: Endoscopy;;  rectum   COLONOSCOPY  2015   Dr.Demason at [place] N/A 07/14/2018   Procedure: COLONOSCOPY WITH PROPOFOL;  Surgeon: Rogene Houston, MD;  [facility];  Service: Endoscopy;  Laterality: N/A;   2:20   POLYPECTOMY  07/14/2018   Procedure: POLYPECTOMY;  Surgeon: Rogene Houston, MD;  [facility];  Service: Endoscopy;;  rectum   VIDEO ASSISTED THORACOSCOPY (VATS)/ LOBECTOMY Left 03/31/2018   Procedure: VIDEO ASSISTED THORACOSCOPY (VATS)/LEFT UPPER LOBECTOMY;  Surgeon: [name], MD;  Location: [facility];  Service: Thoracic;  Laterality: Left;   WISDOM TOOTH EXTRACTION      Current Outpatient Medications on File Prior to Visit  Medication Sig Dispense Refill   acetaminophen (TYLENOL) 500 MG tablet Take 2 tablets (1,000 mg total) by mouth every 6 (six) hours. 30 tablet 0   albuterol (PROVENTIL HFA;VENTOLIN HFA) 108 (90 Base) MCG/ACT inhaler Inhale 1-2 puffs into the lungs every 4 (four) hours as needed for wheezing or shortness of breath.     levothyroxine (SYNTHROID, LEVOTHROID) 25 MCG tablet Take 25 mcg by mouth daily before breakfast.     linaCLOtide (LINZESS PO) Take by mouth daily.     oxyCODONE-acetaminophen (PERCOCET) 10-325 MG tablet Take 1 tablet by mouth every 6 (six) hours as needed for pain.     tiZANidine (ZANAFLEX) 4 MG tablet Take 4 mg by mouth every 8 (eight) hours as needed for muscle spasms.      vitamin C (ASCORBIC ACID) 500 MG tablet Take 500 mg by mouth daily.      No current facility-administered medications on file prior to visit.     Allergies  Allergen Reactions   Coconut Oil Swelling    Anything coconut   Codeine Rash   Review of Systems See HPI, all other systems reviewed and are negative    Objective:   Physical Exam  BP (!) 152/96    Pulse 88    Temp 98.2 F (36.8 C) (Oral)    Resp 18    Ht 5' 5.5" (1.664 m)    Wt 146 lb 9.6 oz (66.5 kg)    LMP 03/30/2007    BMI 24.02 kg/m  General: well developed female in NAD Eyes: sclera nonicteric, conjunctiva pink Mouth: dentition intact Neck: supple, no thyromegaly or lymphadenopathy  Heart: RRR, MVP Lungs: clear throughout  Abdomen: soft, nontender, + BS x 4 quads, no  HSM Rectal: small anterior and posterior anal tags, ? superficial left small lateral anal fissure tender, no fistula or abscess, no blood or mucous in rectum, rectal mucosa felt boggy  Extremities: no edema Neuro: alert and oriented x 4, no focal deficits    Assessment & Plan:   1. 53 y.o. female with rectal bleeding, colonoscopy 06/2018 identified acute proctitis without histological evidence of IBD treated with Canasa, Hydrocortisone enema with worsening blood and mucous per the rectum, intermittent anal pain -flexible sigmoidoscopy to reassess proctitis, rule out ulcerative proctitis, rule out CMV  -if ulcerative proctitis confirmed on biopsy consider trial of Mesalamine 63m enema  -further management to be determined after flexible sigmoidoscopy completed  -No NSAIDS discussed with patient -Desitin use a small amount inside the anal opening twice daily as needed   2. Left Lung Adenocarcinoma stage 1b s/p s/p left VATS with wedge resection of left upper nodule, thoracoscopic  left upper lobectomy and mediastinal lymph node dissection  03/31/2018  3. Family history of Ulcerative Colitis and Crohn's disease

## 2018-12-04 ENCOUNTER — Encounter (HOSPITAL_COMMUNITY)
Admission: RE | Admit: 2018-12-04 | Discharge: 2018-12-04 | Disposition: A | Discharge status: Home / Self Care | Payer: BC Managed Care – PPO | Source: Ambulatory Visit | Attending: Internal Medicine | Admitting: Internal Medicine

## 2018-12-04 ENCOUNTER — Encounter (INDEPENDENT_AMBULATORY_CARE_PROVIDER_SITE_OTHER): Payer: Self-pay | Admitting: Nurse Practitioner

## 2018-12-04 ENCOUNTER — Other Ambulatory Visit: Payer: Self-pay

## 2018-12-04 ENCOUNTER — Ambulatory Visit (INDEPENDENT_AMBULATORY_CARE_PROVIDER_SITE_OTHER): Payer: BC Managed Care – PPO | Admitting: Nurse Practitioner

## 2018-12-04 ENCOUNTER — Encounter (INDEPENDENT_AMBULATORY_CARE_PROVIDER_SITE_OTHER): Payer: Self-pay | Admitting: *Deleted

## 2018-12-04 ENCOUNTER — Other Ambulatory Visit (HOSPITAL_COMMUNITY)
Admission: RE | Admit: 2018-12-04 | Discharge: 2018-12-04 | Disposition: A | Discharge status: Home / Self Care | Payer: BC Managed Care – PPO | Source: Ambulatory Visit | Attending: Internal Medicine | Admitting: Internal Medicine

## 2018-12-04 VITALS — BP 152/96 | HR 88 | Temp 98.2°F | Resp 18 | Ht 65.5 in | Wt 146.6 lb

## 2018-12-04 DIAGNOSIS — Z01812 Encounter for preprocedural laboratory examination: Secondary | ICD-10-CM | POA: Insufficient documentation

## 2018-12-04 DIAGNOSIS — K6289 Other specified diseases of anus and rectum: Secondary | ICD-10-CM | POA: Diagnosis not present

## 2018-12-04 DIAGNOSIS — Z20828 Contact with and (suspected) exposure to other viral communicable diseases: Secondary | ICD-10-CM | POA: Insufficient documentation

## 2018-12-04 DIAGNOSIS — K5120 Ulcerative (chronic) proctitis without complications: Secondary | ICD-10-CM | POA: Insufficient documentation

## 2018-12-04 DIAGNOSIS — K51218 Ulcerative (chronic) proctitis with other complication: Secondary | ICD-10-CM | POA: Diagnosis not present

## 2018-12-04 LAB — SARS CORONAVIRUS 2 BY RT PCR (HOSPITAL ORDER, PERFORMED IN ~~LOC~~ HOSPITAL LAB): SARS Coronavirus 2: NEGATIVE

## 2018-12-04 NOTE — Patient Instructions (Signed)
1. Schedule a flexible sigmoidoscopy with Dr. Laural Golden   2. Further follow up to be determined after flexible sigmoidoscopy completed   3. Desitin apply a small amount inside the anal opening and to external anal area as needed for anal pain/bleeding

## 2018-12-04 NOTE — Patient Instructions (Addendum)
Your procedure is scheduled on: 12/05/2018  Report to [facility] at   11:45  AM.  Call this number if you have problems the morning of surgery: [phone]   Remember:   Do not Eat or Drink after midnight   :  Take these medicines the morning of surgery with A SIP OF WATER: Synthroid   Do not wear jewelry, make-up or nail polish.  Do not wear lotions, powders, or perfumes. You may wear deodorant.  Do not shave 48 hours prior to surgery. Men may shave face and neck.  Do not bring valuables to the hospital.  Contacts, dentures or bridgework may not be worn into surgery.  Leave suitcase in the car. After surgery it may be brought to your room.  For patients admitted to the hospital, checkout time is 11:00 AM the day of discharge.   Patients discharged the day of surgery will not be allowed to drive home.     Flexible Sigmoidoscopy  Flexible sigmoidoscopy is a procedure to check the lower colon (sigmoid colon). The procedure is done using a short, flexible tube that has a small camera attached (sigmoidoscope). The sigmoidoscope is inserted into the anus and passed through the rectum into the sigmoid colon. The camera on the scope sends images to a TV monitor in the exam room. You may need this exam if you have had changes in your bowel habits, bleeding from your rectum, or pain in your abdomen. You may also need this test if your health care provider wants to check for abnormal growths in your rectum or lower colon. Tell a health care provider about:  Any allergies you have.  All medicines you are taking, including vitamins, herbs, eye drops, creams, and over-the-counter medicines.  Any problems you or family members have had with anesthetic medicines.  Any blood disorders you have.  Any surgeries you have had.  Any medical conditions you have.  Whether you are pregnant or may be pregnant. What are the risks? Generally, this is a safe procedure. However, problems may occur,  including:  Abdominal pain.  Bleeding.  Infection or inflammation in your colon.  A tear through your rectum or colon (perforation).  Allergic reactions to medicines.  Damage to other structures or organs. What happens before the procedure?  Follow instructions from your health care provider about eating and drinking restrictions.  Ask your health care provider about: ? Changing or stopping your regular medicines. This is especially important if you are taking diabetes medicines or blood thinners. ? Taking medicines such as aspirin and ibuprofen. These medicines can thin your blood. Do not take these medicines before your procedure if your health care provider instructs you not to.  Plan to have someone take you home from the hospital or clinic.  If you will be going home right after the procedure, plan to have someone with you for 24 hours.  You will need to follow a specific diet and use a laxative or an enema before the procedure (bowel prep). This is to clean out your colon. The bowel prep is often done the night before the procedure or the morning of the procedure. Follow instructions exactly as given by your health care provider.  You may need to have a rectal suppository or enema in the morning before your procedure. What happens during the procedure?  An IV tube may be inserted into one of your veins.  You may be given a medicine to help you relax (sedative).  You  will lie on your side on the exam table. You may be asked to change positions during the procedure.  The sigmoidoscope will be lubricated and gently inserted into your anus.  Air will be injected into your colon, and the sigmoidoscope will be moved into your sigmoid colon. You may feel some pressure or cramping.  Your health care provider will check the monitor for any abnormal findings.  Your health care provider may take a small piece of tissue to check under a microscope (biopsy).  The scope will be  slowly removed. The procedure may vary among health care providers and hospitals. What happens after the procedure?  Your blood pressure, heart rate, breathing rate, and blood oxygen level will be monitored until the medicines you were given have worn off.  Do not drive for 24 hours if you received a sedative. This information is not intended to replace advice given to you by your health care provider. Make sure you discuss any questions you have with your health care provider. Document Released: 04/09/2000 Document Revised: 12/04/2015 Document Reviewed: 07/12/2015 Elsevier Patient Education  [employer].  Flexible Sigmoidoscopy, Care After This sheet gives you information about how to care for yourself after your procedure. Your health care provider may also give you more specific instructions. If you have problems or questions, contact your health care provider. What can I expect after the procedure? After the procedure, it is common to have:  Abdominal cramping or pain.  Bloating.  A small amount of rectal bleeding if you had a biopsy. Follow these instructions at home:  Take over-the-counter and prescription medicines only as told by your health care provider.  Do not drive for 24 hours if you received a medicine to help you relax (sedative).  Keep all follow-up visits as told by your health care provider. This is important. Contact a health care provider if:  You have abdominal pain or cramping that gets worse or is not helped with medicine.  You continue to have small amounts of rectal bleeding after 24 hours.  You have nausea or vomiting.  You feel weak or dizzy.  You have a fever. Get help right away if:  You pass large blood clots or see a large amount of blood in the toilet after having a bowel movement.  You have nausea or vomiting for more than 24 hours after the procedure. This information is not intended to replace advice given to you by your health  care provider. Make sure you discuss any questions you have with your health care provider. Document Released: 04/17/2013 Document Revised: 12/04/2015 Document Reviewed: 07/12/2015 Elsevier Patient Education  2020 [employer].

## 2018-12-05 ENCOUNTER — Ambulatory Visit (HOSPITAL_COMMUNITY): Payer: BC Managed Care – PPO | Admitting: Anesthesiology

## 2018-12-05 ENCOUNTER — Encounter (HOSPITAL_COMMUNITY)
Admission: RE | Disposition: A | Discharge status: Home / Self Care | Payer: Self-pay | Source: Home / Self Care | Attending: Internal Medicine

## 2018-12-05 ENCOUNTER — Encounter (HOSPITAL_COMMUNITY): Payer: Self-pay

## 2018-12-05 ENCOUNTER — Other Ambulatory Visit: Payer: Self-pay

## 2018-12-05 ENCOUNTER — Ambulatory Visit (HOSPITAL_COMMUNITY)
Admission: RE | Admit: 2018-12-05 | Discharge: 2018-12-05 | Disposition: A | Discharge status: Home / Self Care | Payer: BC Managed Care – PPO | Attending: Internal Medicine | Admitting: Internal Medicine

## 2018-12-05 DIAGNOSIS — F419 Anxiety disorder, unspecified: Secondary | ICD-10-CM | POA: Insufficient documentation

## 2018-12-05 DIAGNOSIS — Z87891 Personal history of nicotine dependence: Secondary | ICD-10-CM | POA: Insufficient documentation

## 2018-12-05 DIAGNOSIS — K529 Noninfective gastroenteritis and colitis, unspecified: Secondary | ICD-10-CM | POA: Diagnosis not present

## 2018-12-05 DIAGNOSIS — K5120 Ulcerative (chronic) proctitis without complications: Secondary | ICD-10-CM | POA: Insufficient documentation

## 2018-12-05 DIAGNOSIS — K5190 Ulcerative colitis, unspecified, without complications: Secondary | ICD-10-CM | POA: Diagnosis not present

## 2018-12-05 DIAGNOSIS — Z09 Encounter for follow-up examination after completed treatment for conditions other than malignant neoplasm: Secondary | ICD-10-CM | POA: Diagnosis not present

## 2018-12-05 DIAGNOSIS — E039 Hypothyroidism, unspecified: Secondary | ICD-10-CM | POA: Insufficient documentation

## 2018-12-05 DIAGNOSIS — G8929 Other chronic pain: Secondary | ICD-10-CM | POA: Diagnosis not present

## 2018-12-05 DIAGNOSIS — F329 Major depressive disorder, single episode, unspecified: Secondary | ICD-10-CM | POA: Diagnosis not present

## 2018-12-05 DIAGNOSIS — J449 Chronic obstructive pulmonary disease, unspecified: Secondary | ICD-10-CM | POA: Insufficient documentation

## 2018-12-05 DIAGNOSIS — K6289 Other specified diseases of anus and rectum: Secondary | ICD-10-CM

## 2018-12-05 HISTORY — PX: BIOPSY: SHX5522

## 2018-12-05 HISTORY — PX: FLEXIBLE SIGMOIDOSCOPY: SHX5431

## 2018-12-05 SURGERY — SIGMOIDOSCOPY, FLEXIBLE
Anesthesia: General

## 2018-12-05 MED ORDER — LACTATED RINGERS IV SOLN
INTRAVENOUS | Status: DC
  Administered 2018-12-05: 12:00:00 via INTRAVENOUS

## 2018-12-05 MED ORDER — CHLORHEXIDINE GLUCONATE CLOTH 2 % EX PADS: 6.0000 | MEDICATED_PAD | Freq: Once | CUTANEOUS | Status: DC

## 2018-12-05 MED ORDER — MIDAZOLAM HCL 2 MG/2ML IJ SOLN: 0.5000 mg | Freq: Once | INTRAMUSCULAR | Status: DC | PRN

## 2018-12-05 MED ORDER — PROPOFOL 500 MG/50ML IV EMUL
INTRAVENOUS | Status: DC | PRN
  Administered 2018-12-05: 150 ug/kg/min via INTRAVENOUS

## 2018-12-05 MED ORDER — PROPOFOL 10 MG/ML IV BOLUS
INTRAVENOUS | Status: DC | PRN
  Administered 2018-12-05: 20 mg via INTRAVENOUS

## 2018-12-05 MED ORDER — PROMETHAZINE HCL 25 MG/ML IJ SOLN: 6.2500 mg | INTRAMUSCULAR | Status: DC | PRN

## 2018-12-05 NOTE — Discharge Instructions (Signed)
No aspirin or NSAIDs for 24 hours. Resume other medications as before. Resume usual diet. No driving for 24 hours. Physician will call with biopsy results and further recommendations.   Colonoscopy, Adult, Care After This sheet gives you information about how to care for yourself after your procedure. Your doctor may also give you more specific instructions. If you have problems or questions, call your doctor. What can I expect after the procedure? After the procedure, it is common to have:  A small amount of blood in your poop for 24 hours.  Some gas.  Mild cramping or bloating in your belly. Follow these instructions at home: General instructions  For the first 24 hours after the procedure: ? Do not drive or use machinery. ? Do not sign important documents. ? Do not drink alcohol. ? Do your daily activities more slowly than normal. ? Eat foods that are soft and easy to digest.  Take over-the-counter or prescription medicines only as told by your doctor. To help cramping and bloating:   Try walking around.  Put heat on your belly (abdomen) as told by your doctor. Use a heat source that your doctor recommends, such as a moist heat pack or a heating pad. ? Put a towel between your skin and the heat source. ? Leave the heat on for 20-30 minutes. ? Remove the heat if your skin turns bright red. This is especially important if you cannot feel pain, heat, or cold. You can get burned. Eating and drinking   Drink enough fluid to keep your pee (urine) clear or pale yellow.  Return to your normal diet as told by your doctor. Avoid heavy or fried foods that are hard to digest.  Avoid drinking alcohol for as long as told by your doctor. Contact a doctor if:  You have blood in your poop (stool) 2-3 days after the procedure. Get help right away if:  You have more than a small amount of blood in your poop.  You see large clumps of tissue (blood clots) in your poop.  Your belly  is swollen.  You feel sick to your stomach (nauseous).  You throw up (vomit).  You have a fever.  You have belly pain that gets worse, and medicine does not help your pain. Summary  After the procedure, it is common to have a small amount of blood in your poop. You may also have mild cramping and bloating in your belly.  For the first 24 hours after the procedure, do not drive or use machinery, do not sign important documents, and do not drink alcohol.  Get help right away if you have a lot of blood in your poop, feel sick to your stomach, have a fever, or have more belly pain. This information is not intended to replace advice given to you by your health care provider. Make sure you discuss any questions you have with your health care provider. Document Released: 05/15/2010 Document Revised: 02/10/2017 Document Reviewed: 01/05/2016 Elsevier Patient Education  2020 [employer].

## 2018-12-05 NOTE — Anesthesia Procedure Notes (Signed)
Procedure Name: General with mask airway Date/Time: 12/05/2018 1:04 PM Performed by: Andree Elk, Amy A, CRNA Pre-anesthesia Checklist: Timeout performed, Patient being monitored, Suction available, Emergency Drugs available and Patient identified Oxygen Delivery Method: Non-rebreather mask

## 2018-12-05 NOTE — Anesthesia Preprocedure Evaluation (Signed)
Anesthesia Evaluation  Patient identified by MRN, date of birth, ID band Patient awake    Reviewed: Allergy & Precautions, NPO status , Patient's Chart, lab work & pertinent test results  Airway Mallampati: II  TM Distance: >3 FB Neck ROM: Full    Dental no notable dental hx. (+) Teeth Intact   Pulmonary COPD,  COPD inhaler, former smoker,  States wa stold pnemonia was misdx- and that it was cancer  S/p RUL -VATS 03/2018   Pulmonary exam normal breath sounds clear to auscultation       Cardiovascular Exercise Tolerance: Good Normal cardiovascular examI+ Valvular Problems/Murmurs MVP  Rhythm:Regular Rate:Normal     Neuro/Psych Seizures -, Well Controlled,  Anxiety Depression No current Sz meds -states were related to Stress- last Sz 2015 negative psych ROS   GI/Hepatic negative GI ROS, Neg liver ROS, Here for Flex sig -s/p recent Colonoscopy on 07/14/18   Endo/Other  negative endocrine ROS  Renal/GU negative Renal ROS  negative genitourinary   Musculoskeletal negative musculoskeletal ROS (+)   Abdominal   Peds negative pediatric ROS (+)  Hematology negative hematology ROS (+)   Anesthesia Other Findings   Reproductive/Obstetrics negative OB ROS                             Anesthesia Physical Anesthesia Plan  ASA: III  Anesthesia Plan: General   Post-op Pain Management:    Induction: Intravenous  PONV Risk Score and Plan: 3 and TIVA, Propofol infusion, Ondansetron and Treatment may vary due to age or medical condition  Airway Management Planned: Nasal Cannula and Simple Face Mask  Additional Equipment:   Intra-op Plan:   Post-operative Plan:   Informed Consent: I have reviewed the patients History and Physical, chart, labs and discussed the procedure including the risks, benefits and alternatives for the proposed anesthesia with the patient or authorized representative who has  indicated his/her understanding and acceptance.     Dental advisory given  Plan Discussed with: CRNA  Anesthesia Plan Comments: (Plan Full PPE use  Plan GA with GETA as needed -d/w pt -WTP with same after Q&A)        Anesthesia Quick Evaluation

## 2018-12-05 NOTE — Transfer of Care (Signed)
Immediate Anesthesia Transfer of Care Note  Patient: Yolanda [name]  Procedure(s) Performed: FLEXIBLE SIGMOIDOSCOPY WITH PROPOFOL (N/A ) BIOPSY  Patient Location: PACU  Anesthesia Type:General  Level of Consciousness: awake, alert , oriented and patient cooperative  Airway & Oxygen Therapy: Patient Spontanous Breathing  Post-op Assessment: Report given to RN and Post -op Vital signs reviewed and stable  Post vital signs: Reviewed and stable  Last Vitals:  Vitals Value Taken Time  BP 142/88 12/05/18 1326  Temp    Pulse 85 12/05/18 1328  Resp 21 12/05/18 1328  SpO2 97 % 12/05/18 1328  Vitals shown include unvalidated device data.  Last Pain:  Vitals:   12/05/18 1305  TempSrc:   PainSc: 0-No pain      Patients Stated Pain Goal: 5 ([id number])  Complications: No apparent anesthesia complications

## 2018-12-05 NOTE — Anesthesia Postprocedure Evaluation (Signed)
Anesthesia Post Note  Patient: Yolanda [name]  Procedure(s) Performed: FLEXIBLE SIGMOIDOSCOPY WITH PROPOFOL (N/A ) BIOPSY  Patient location during evaluation: PACU Anesthesia Type: General Level of consciousness: awake and alert, oriented and patient cooperative Pain management: pain level controlled Vital Signs Assessment: post-procedure vital signs reviewed and stable Respiratory status: spontaneous breathing Cardiovascular status: stable Postop Assessment: no apparent nausea or vomiting Anesthetic complications: no     Last Vitals:  Vitals:   12/05/18 1210 12/05/18 1326  BP: (!) 161/103 (!) (P) 142/88  Pulse: 96 (P) 80  Resp: 16 (P) 15  Temp: 36.8 C (P) 36.6 C  SpO2: 97% (P) 99%    Last Pain:  Vitals:   12/05/18 1305  TempSrc:   PainSc: 0-No pain                 ADAMS, AMY A

## 2018-12-05 NOTE — H&P (Signed)
Yolanda [name] is an 53 y.o. female.   Chief Complaint: Patient is here for flexible sigmoidoscopy. HPI: [name] who underwent diagnostic colonoscopy in March this year.  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  She has been treated with mesalamine suppositories followed by hydrocortisone enemas but without symptomatic improvement.  Now she returns with worsening symptoms.  She is passing blood every time she has a bowel movement.  She also has tenesmus.  She feels like she needs to have a bowel movement in all she passes mucus and blood.  He feels she is passing at least moderate amount of blood per rectum.  She denies diarrhea. Family history is positive for ulcerative colitis in her father and brother.  Past Medical History:  Diagnosis Date  . Anxiety   . Cancer ([place])   . Chronic pain   . COPD (chronic obstructive pulmonary disease) ([place])   . Depression   . Heart murmur   . Hemorrhoids    bleed frequently  . History of kidney stones   . Mitral valve prolapse   . Pneumonia   . Seizures ([place])   . Thyroid disease     Past Surgical History:  Procedure Laterality Date  . BACK SURGERY    . BIOPSY  07/14/2018   Procedure: BIOPSY;  Surgeon: Rogene Houston, MD;  [facility];  Service: Endoscopy;;  rectum  . COLONOSCOPY  2015   [facility]  . COLONOSCOPY WITH PROPOFOL N/A 07/14/2018   Procedure: COLONOSCOPY WITH PROPOFOL;  Surgeon: Rogene Houston, MD;  Location: [facility];  Service: Endoscopy;  Laterality: N/A;  2:20  . POLYPECTOMY  07/14/2018   Procedure: POLYPECTOMY;  Surgeon: Rogene Houston, MD;  [facility];  Service: Endoscopy;;  rectum  . VIDEO ASSISTED THORACOSCOPY (VATS)/ LOBECTOMY Left 03/31/2018   Procedure: VIDEO ASSISTED THORACOSCOPY (VATS)/LEFT UPPER LOBECTOMY;  Surgeon: [name], MD;  Location: [facility];  Service: Thoracic;  Laterality: Left;  . WISDOM TOOTH  EXTRACTION      Family History  Problem Relation Age of Onset  . Breast cancer Mother   . Colon cancer Brother   . Clotting disorder Paternal Aunt   . Colitis Father   . Iron deficiency Father   . Depression Sister   . Alcoholism Sister   . Healthy Brother   . Healthy Daughter   . Healthy Son    Social History:  reports that she quit smoking about 9 months ago. Her smoking use included cigarettes. She has a 35.00 pack-year smoking history. She has never used smokeless tobacco. She reports that she does not drink alcohol or use drugs.  Allergies:  Allergies  Allergen Reactions  . Coconut Oil Swelling    Anything coconut  . Codeine Rash    Medications Prior to Admission  Medication Sig Dispense Refill  . acetaminophen (TYLENOL) 500 MG tablet Take 2 tablets (1,000 mg total) by mouth every 6 (six) hours. 30 tablet 0  . albuterol (PROVENTIL HFA;VENTOLIN HFA) 108 (90 Base) MCG/ACT inhaler Inhale 1-2 puffs into the lungs every 4 (four) hours as needed for wheezing or shortness of breath.    . levothyroxine (SYNTHROID, LEVOTHROID) 25 MCG tablet Take 25 mcg by mouth daily before breakfast.    . linaCLOtide (LINZESS PO) Take by mouth daily.    Marland Kitchen oxyCODONE-acetaminophen (PERCOCET) 10-325 MG tablet Take 1 tablet by mouth every 6 (six) hours as needed for pain.    Marland Kitchen  tiZANidine (ZANAFLEX) 4 MG tablet Take 4 mg by mouth every 8 (eight) hours as needed for muscle spasms.     . vitamin C (ASCORBIC ACID) 500 MG tablet Take 500 mg by mouth daily.       Results for orders placed or performed during the hospital encounter of 12/04/18 (from the past 48 hour(s))  SARS Coronavirus 2 [facility] order, Performed in [facility] hospital lab) Nasopharyngeal Nasopharyngeal Swab     Status: None   Collection Time: 12/04/18 10:16 AM   Specimen: Nasopharyngeal Swab  Result Value Ref Range   SARS Coronavirus 2 NEGATIVE NEGATIVE    Comment: (NOTE) If result is NEGATIVE SARS-CoV-2 target nucleic acids are  NOT DETECTED. The SARS-CoV-2 RNA is generally detectable in upper and lower  respiratory specimens during the acute phase of infection. The lowest  concentration of SARS-CoV-2 viral copies this assay can detect is 250  copies / mL. A negative result does not preclude SARS-CoV-2 infection  and should not be used as the sole basis for treatment or other  patient management decisions.  A negative result may occur with  improper specimen collection / handling, submission of specimen other  than nasopharyngeal swab, presence of viral mutation(s) within the  areas targeted by this assay, and inadequate number of viral copies  (<250 copies / mL). A negative result must be combined with clinical  observations, patient history, and epidemiological information. If result is POSITIVE SARS-CoV-2 target nucleic acids are DETECTED. The SARS-CoV-2 RNA is generally detectable in upper and lower  respiratory specimens dur ing the acute phase of infection.  Positive  results are indicative of active infection with SARS-CoV-2.  Clinical  correlation with patient history and other diagnostic information is  necessary to determine patient infection status.  Positive results do  not rule out bacterial infection or co-infection with other viruses. If result is PRESUMPTIVE POSTIVE SARS-CoV-2 nucleic acids MAY BE PRESENT.   A presumptive positive result was obtained on the submitted specimen  and confirmed on repeat testing.  While 2019 novel coronavirus  (SARS-CoV-2) nucleic acids may be present in the submitted sample  additional confirmatory testing may be necessary for epidemiological  and / or clinical management purposes  to differentiate between  SARS-CoV-2 and other Sarbecovirus currently known to infect humans.  If clinically indicated additional testing with an alternate test  methodology [phone]) is advised. The SARS-CoV-2 RNA is generally  detectable in upper and lower respiratory sp ecimens  during the acute  phase of infection. The expected result is Negative. Fact Sheet for Patients:  StrictlyIdeas.no Fact Sheet for Healthcare Providers: BankingDealers.co.za This test is not yet approved or cleared by the [place] FDA and has been authorized for detection and/or diagnosis of SARS-CoV-2 by FDA under an Emergency Use Authorization (EUA).  This EUA will remain in effect (meaning this test can be used) for the duration of the COVID-19 declaration under Section 564(b)(1) of the Act, 21 U.S.C. section 360bbb-3(b)(1), unless the authorization is terminated or revoked sooner. Performed at [facility], [address], [place], [place] [address]    No results found.  ROS  Blood pressure (!) 161/103, pulse 96, temperature 98.3 F (36.8 C), temperature source Oral, resp. rate 16, last menstrual period 03/30/2007, SpO2 97 %. Physical Exam  Constitutional: She appears well-developed and well-nourished.  HENT:  Mouth/Throat: Oropharynx is clear and moist.  Eyes: Conjunctivae are normal. No scleral icterus.  Neck: No thyromegaly present.  Cardiovascular: Normal rate, regular rhythm and normal  heart sounds.  No murmur heard. Respiratory: Effort normal and breath sounds normal.  GI: Soft. She exhibits no distension and no mass. There is no abdominal tenderness.  Musculoskeletal:        General: No edema.  Lymphadenopathy:    She has no cervical adenopathy.  Neurological: She is alert.  Skin: Skin is warm.  Patient has tattoo over right scapula.     Assessment/Plan Patient with recent diagnosis of ulcerative proctitis who is unresponsive to therapy and now has worsening symptoms. Patient is undergoing flexible sigmoidoscopy to reevaluate her her proctitis/colitis before systemic therapy instituted.  Hildred Laser, MD 12/05/2018, 1:00 PM

## 2018-12-05 NOTE — Op Note (Signed)
[facility] Patient Name: Yolanda [name] Procedure Date: 12/05/2018 12:46 PM MRN: [id number] Date of Birth: 04-Mar-1966 Attending MD: Hildred Laser , MD CSN: [id number] Age: 53 Admit Type: Outpatient Procedure:                Flexible Sigmoidoscopy Indications:              Follow-up of chronic ulcerative proctitis Providers:                Hildred Laser, MD, Charlsie Quest. Theda Sers RN, RN,                            Randa Spike, Technician Referring MD:             Stoney Bang, MD Medicines:                Propofol per Anesthesia Complications:            No immediate complications. Estimated Blood Loss:     Estimated blood loss was minimal. Procedure:                Pre-Anesthesia Assessment:                           - Prior to the procedure, a History and Physical                            was performed, and patient medications and                            allergies were reviewed. The patient's tolerance of                            previous anesthesia was also reviewed. The risks                            and benefits of the procedure and the sedation                            options and risks were discussed with the patient.                            All questions were answered, and informed consent                            was obtained. Prior Anticoagulants: The patient has                            taken no previous anticoagulant or antiplatelet                            agents. ASA Grade Assessment: III - A patient with                            severe systemic disease. After reviewing the risks  and benefits, the patient was deemed in                            satisfactory condition to undergo the procedure.                           After obtaining informed consent, the scope was                            passed under direct vision. The [id number]-[id number]                            ([id number]) scope was introduced through the anus and                      advanced to the the sigmoid colon. The flexible                            sigmoidoscopy was accomplished without difficulty.                            The patient tolerated the procedure well. The                            quality of the bowel preparation was excellent. Scope In: 1:13:22 PM Scope Out: 1:22:01 PM Total Procedure Duration: 0 hours 8 minutes 39 seconds  Findings:      The perianal and digital rectal examinations were normal.      The sigmoid colon appeared normal. Biopsies were taken with a cold       forceps for histology. The pathology specimen was placed into Bottle       Number 1.      Inflammation characterized by altered vascularity, congestion (edema),       erosions and erythema was found in a continuous and circumferential       pattern from the anus to the rectum. This was moderate in severity, and       when compared to previous examinations, the findings are worsened.       Biopsies were taken with a cold forceps for histology. The pathology       specimen was placed into Bottle Number 2. Impression:               - The sigmoid colon is normal. Biopsied.                           - Proctitis. Inflammation was found from the anus                            to the rectum. This was moderate in severity,                            worsened compared to previous examinations.                            Biopsied. Moderate Sedation:      Per Anesthesia Care Recommendation:           -  Patient has a contact number available for                            emergencies. The signs and symptoms of potential                            delayed complications were discussed with the                            patient. Return to normal activities tomorrow.                            Written discharge instructions were provided to the                            patient.                           - Resume previous diet today.                           -  Continue present medications.                           - No aspirin, ibuprofen, naproxen, or other                            non-steroidal anti-inflammatory drugs for 1 day.                           - Await pathology results. Procedure Code(s):        --- Professional ---                           [phone], Sigmoidoscopy, flexible; with biopsy, single                            or multiple Diagnosis Code(s):        --- Professional ---                           K52.9, Noninfective gastroenteritis and colitis,                            unspecified                           K51.20, Ulcerative (chronic) proctitis without                            complications CPT copyright 2019 American Medical Association. All rights reserved. The codes documented in this report are preliminary and upon coder review may  be revised to meet current compliance requirements. Hildred Laser, MD Hildred Laser, MD 12/05/2018 1:36:20 PM This report has been signed electronically. Number of Addenda: 0

## 2018-12-08 ENCOUNTER — Other Ambulatory Visit (INDEPENDENT_AMBULATORY_CARE_PROVIDER_SITE_OTHER): Payer: Self-pay | Admitting: Internal Medicine

## 2018-12-08 MED ORDER — MESALAMINE 1.2 G PO TBEC: 2.4000 g | DELAYED_RELEASE_TABLET | Freq: Two times a day (BID) | ORAL | 5 refills | Status: DC

## 2018-12-08 MED ORDER — PREDNISONE 10 MG PO TABS: 30.0000 mg | ORAL_TABLET | Freq: Every day | ORAL | 0 refills | Status: DC

## 2018-12-14 ENCOUNTER — Encounter (HOSPITAL_COMMUNITY): Payer: Self-pay | Admitting: Internal Medicine

## 2019-01-13 NOTE — Progress Notes (Deleted)
   Subjective:    Patient ID: Yolanda [name], female    DOB: May 26, 1965, 53 y.o.   MRN: [id number]  HPI Yolanda [name] is a 53 year old female with a past medical history significant for anxiety, depression, chronic pain, seizure disorder, hypothyroidism, MVP, kidney stones, ulcerative proctiits diagnosed 06/2018 and lung  adenocarcinoma stage Ib. She is s/p left VATSwith wedge resection of left upper nodule, thoracoscopic left upper lobectomy and mediastinal lymph node dissection by Dr. Roxan Hockey on 03/31/2018. She was seen in the office 12/04/2018 with complaints of constipation and worsening rectal bleeding and mucous from the rectum.  She underwent a flexible sigmoidoscopy with Dr. Laural Golden on 12/05/2018 which showed a normal sigmoid colon, proctitis with Inflammation from the anus to the rectum. This was moderate in severity, worsened compared to previous examinations. Rectal biopsies showed moderately active chronic colitis, no evidence of CMV, granulomata or dysplasia. She was prescribed Prednisone 30mg  taper and Mesalamine 1.2gm two tabs po bid.   Colonoscopy with Dr. Laural Golden 07/14/2018: showed severe ulcerative proctitis, normal colon and TI. The rectal biopsies showed active colitis without evidence of IBD or dysplasia.    Review of Systems     Objective:   Physical Exam        Assessment & Plan:   1. Ulcerative Proctitis  2. Lung adenocarcinoma

## 2019-01-15 ENCOUNTER — Ambulatory Visit (INDEPENDENT_AMBULATORY_CARE_PROVIDER_SITE_OTHER): Payer: BC Managed Care – PPO | Admitting: Nurse Practitioner

## 2019-02-06 DIAGNOSIS — R030 Elevated blood-pressure reading, without diagnosis of hypertension: Secondary | ICD-10-CM | POA: Diagnosis not present

## 2019-02-06 DIAGNOSIS — F1120 Opioid dependence, uncomplicated: Secondary | ICD-10-CM | POA: Diagnosis not present

## 2019-02-06 DIAGNOSIS — M5137 Other intervertebral disc degeneration, lumbosacral region: Secondary | ICD-10-CM | POA: Diagnosis not present

## 2019-02-06 DIAGNOSIS — M5416 Radiculopathy, lumbar region: Secondary | ICD-10-CM | POA: Diagnosis not present

## 2019-02-28 DIAGNOSIS — Z0000 Encounter for general adult medical examination without abnormal findings: Secondary | ICD-10-CM | POA: Diagnosis not present

## 2019-02-28 DIAGNOSIS — Z6825 Body mass index (BMI) 25.0-25.9, adult: Secondary | ICD-10-CM | POA: Diagnosis not present

## 2019-04-10 ENCOUNTER — Ambulatory Visit: Payer: BLUE CROSS/BLUE SHIELD | Admitting: Thoracic Surgery (Cardiothoracic Vascular Surgery)

## 2019-04-10 ENCOUNTER — Other Ambulatory Visit: Payer: Self-pay | Admitting: Thoracic Surgery (Cardiothoracic Vascular Surgery)

## 2019-04-10 DIAGNOSIS — C3492 Malignant neoplasm of unspecified part of left bronchus or lung: Secondary | ICD-10-CM

## 2019-04-12 ENCOUNTER — Ambulatory Visit: Payer: BC Managed Care – PPO | Admitting: Thoracic Surgery (Cardiothoracic Vascular Surgery)

## 2019-04-12 ENCOUNTER — Other Ambulatory Visit: Payer: Self-pay

## 2019-04-12 ENCOUNTER — Encounter: Payer: Self-pay | Admitting: Thoracic Surgery (Cardiothoracic Vascular Surgery)

## 2019-04-12 ENCOUNTER — Ambulatory Visit
Admission: RE | Admit: 2019-04-12 | Discharge: 2019-04-12 | Disposition: A | Discharge status: Home / Self Care | Payer: BC Managed Care – PPO | Source: Ambulatory Visit | Attending: Thoracic Surgery (Cardiothoracic Vascular Surgery) | Admitting: Thoracic Surgery (Cardiothoracic Vascular Surgery)

## 2019-04-12 VITALS — BP 150/88 | HR 88 | Temp 97.9°F | Resp 20 | Ht 65.5 in | Wt 147.0 lb

## 2019-04-12 DIAGNOSIS — C3492 Malignant neoplasm of unspecified part of left bronchus or lung: Secondary | ICD-10-CM

## 2019-04-12 DIAGNOSIS — Z902 Acquired absence of lung [part of]: Secondary | ICD-10-CM | POA: Diagnosis not present

## 2019-04-12 DIAGNOSIS — C3490 Malignant neoplasm of unspecified part of unspecified bronchus or lung: Secondary | ICD-10-CM | POA: Diagnosis not present

## 2019-04-12 NOTE — Progress Notes (Signed)
[address]       [place] [address]             [phone]     HPI: Yolanda [name] returns for scheduled follow-up visit  Yolanda [name] is a 53 year old former smoker who had a thoracoscopic left upper lobectomy for stage Ib adenocarcinoma in December 2019.  Her postoperative course was uncomplicated and she went home on day 3.  Her past medical history is also significant for tobacco abuse (quit over a year ago), COPD, anxiety, depression, seizures, cervical cancer, chronic back pain, and thyroid disease.  I last saw her in the office in late December 2019.  She was doing well at that time but was still having some incisional pain.  She saw Dr. Delton Coombes.  She did not need any adjuvant therapy.  She says overall she feels well.  She is anxious about her chest x-ray.  She has not seen Dr. Delton Coombes since January.  She does have some issues related to her COPD.  She has not smoked since prior to her surgery.  Her appetite is good.  She denies weight loss.  She says that she still has some pain in the left breast primarily when she first lies down at night.  Past Medical History:  Diagnosis Date  . Anxiety   . Cancer ([place])   . Chronic pain   . COPD (chronic obstructive pulmonary disease) ([place])   . Depression   . Heart murmur   . Hemorrhoids    bleed frequently  . History of kidney stones   . Mitral valve prolapse   . Pneumonia   . Seizures ([place])   . Thyroid disease     Current Outpatient Medications  Medication Sig Dispense Refill  . acetaminophen (TYLENOL) 500 MG tablet Take 2 tablets (1,000 mg total) by mouth every 6 (six) hours. 30 tablet 0  . albuterol (PROVENTIL HFA;VENTOLIN HFA) 108 (90 Base) MCG/ACT inhaler Inhale 1-2 puffs into the lungs every 4 (four) hours as needed for wheezing or shortness of breath.    . levothyroxine (SYNTHROID, LEVOTHROID) 25 MCG tablet Take 25 mcg by mouth daily before breakfast.    . linaCLOtide (LINZESS PO) Take by mouth  daily.    . mesalamine (LIALDA) 1.2 g EC tablet Take 2 tablets (2.4 g total) by mouth 2 (two) times daily. 120 tablet 5  . oxyCODONE-acetaminophen (PERCOCET) 10-325 MG tablet Take 1 tablet by mouth every 6 (six) hours as needed for pain.    Marland Kitchen tiZANidine (ZANAFLEX) 4 MG tablet Take 4 mg by mouth every 8 (eight) hours as needed for muscle spasms.     . vitamin C (ASCORBIC ACID) 500 MG tablet Take 500 mg by mouth daily.     . predniSONE (DELTASONE) 10 MG tablet Take 3 tablets (30 mg total) by mouth daily with breakfast. Drop dose by 5 mg every 5 days. (Patient not taking: Reported on 04/12/2019) 100 tablet 0   No current facility-administered medications for this visit.    Physical Exam BP (!) 150/88   Pulse 88   Temp 97.9 F (36.6 C) (Skin)   Resp 20   Ht 5' 5.5" (1.664 m)   Wt 147 lb (66.7 kg)   LMP 03/30/2007   SpO2 97% Comment: RA  BMI 24.62 kg/m  53 year old woman in no acute distress Alert and oriented x3 with no focal deficits Lungs diminished at left base, otherwise clear, no rales or wheezing Cardiac regular rate  and rhythm normal S1 and S2 Incisions well-healed No cervical or supraclavicular adenopathy  Diagnostic Tests: CHEST - 2 VIEW  COMPARISON:  July 04, 2018.  FINDINGS: The heart size and mediastinal contours are within normal limits. No pneumothorax or pleural effusion is noted. Mild left basilar scarring is noted. No acute abnormality is noted. The visualized skeletal structures are unremarkable.  IMPRESSION: No active cardiopulmonary disease.   Electronically Signed   By: Marijo Conception M.D.   On: 04/12/2019 09:56 I personally reviewed the chest x-ray images and concur with the findings noted above  Impression: Yolanda [name] is a 53 year old woman with a history of tobacco abuse, COPD, anxiety, depression, chronic pain, cervical cancer who underwent a thoracoscopic left upper lobectomy for stage Ib adenocarcinoma in December 2019.  She is now  a year out from surgery with no evidence of recurrent disease.  She saw Dr. Delton Coombes following her surgery but has not seen him since.  She should be getting CTs for follow-up.  She is due for one now.  I am going to contact his office and see if we can get her a follow-up appointment arranged.  Tobacco abuse-congratulated her on quitting over a year ago.  Plan: Follow-up with Dr. Delton Coombes I will be happy to see Yolanda [name] back at anytime in the future if I can be of any further assistance with her care.  [name], MD [facility] [phone]

## 2019-04-17 ENCOUNTER — Ambulatory Visit (HOSPITAL_COMMUNITY): Payer: BC Managed Care – PPO | Admitting: Hematology

## 2019-04-17 ENCOUNTER — Inpatient Hospital Stay (HOSPITAL_COMMUNITY): Payer: BC Managed Care – PPO | Attending: Hematology

## 2019-05-12 IMAGING — CR DG CHEST 2V
2 series · 2 of 2 positions shown · non-contrast
Comparison: 03/11/2018, 02/09/2018.  CT 02/10/2018.

CLINICAL DATA: Lung nodule.  Planned surgery.

EXAM:
CHEST - 2 VIEW

[w chest pa]
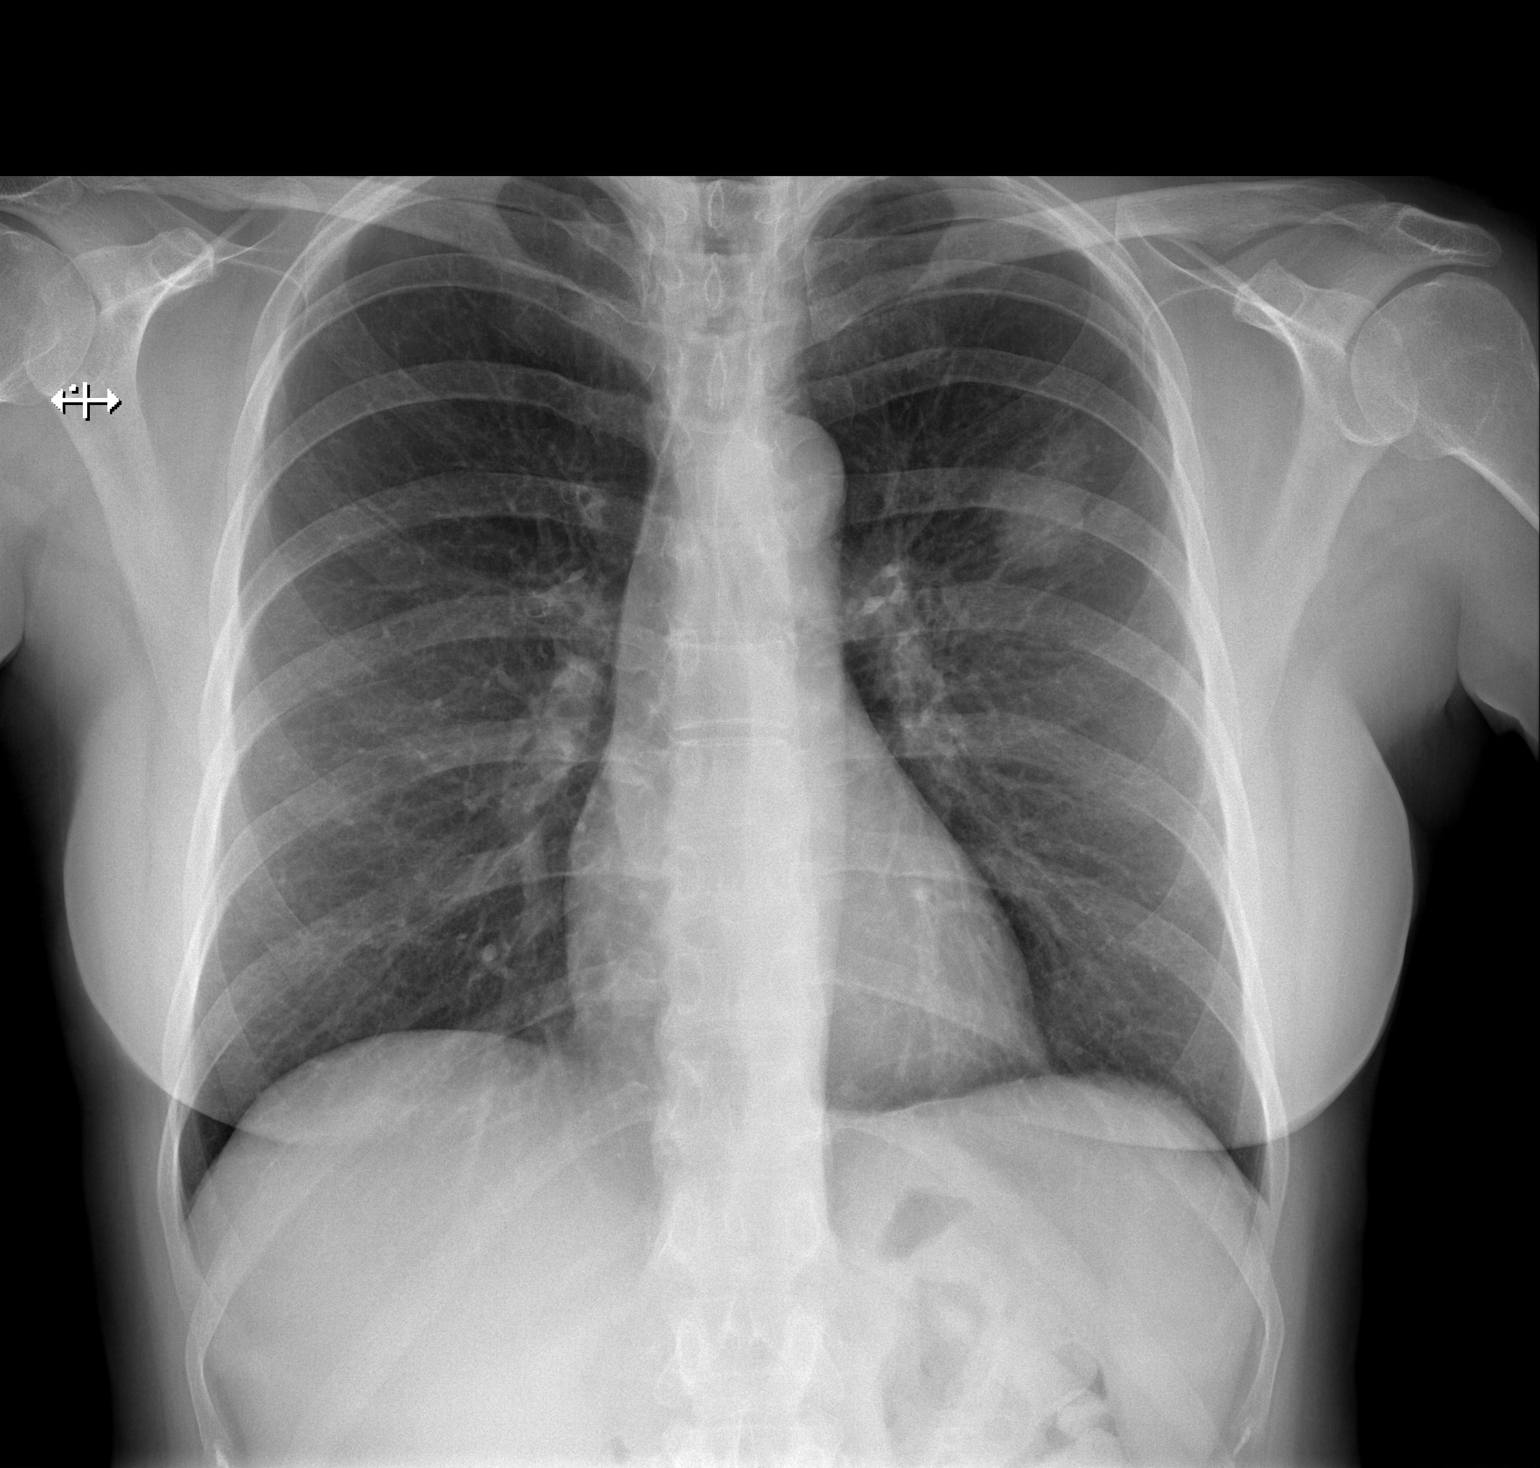

[w chest lat]
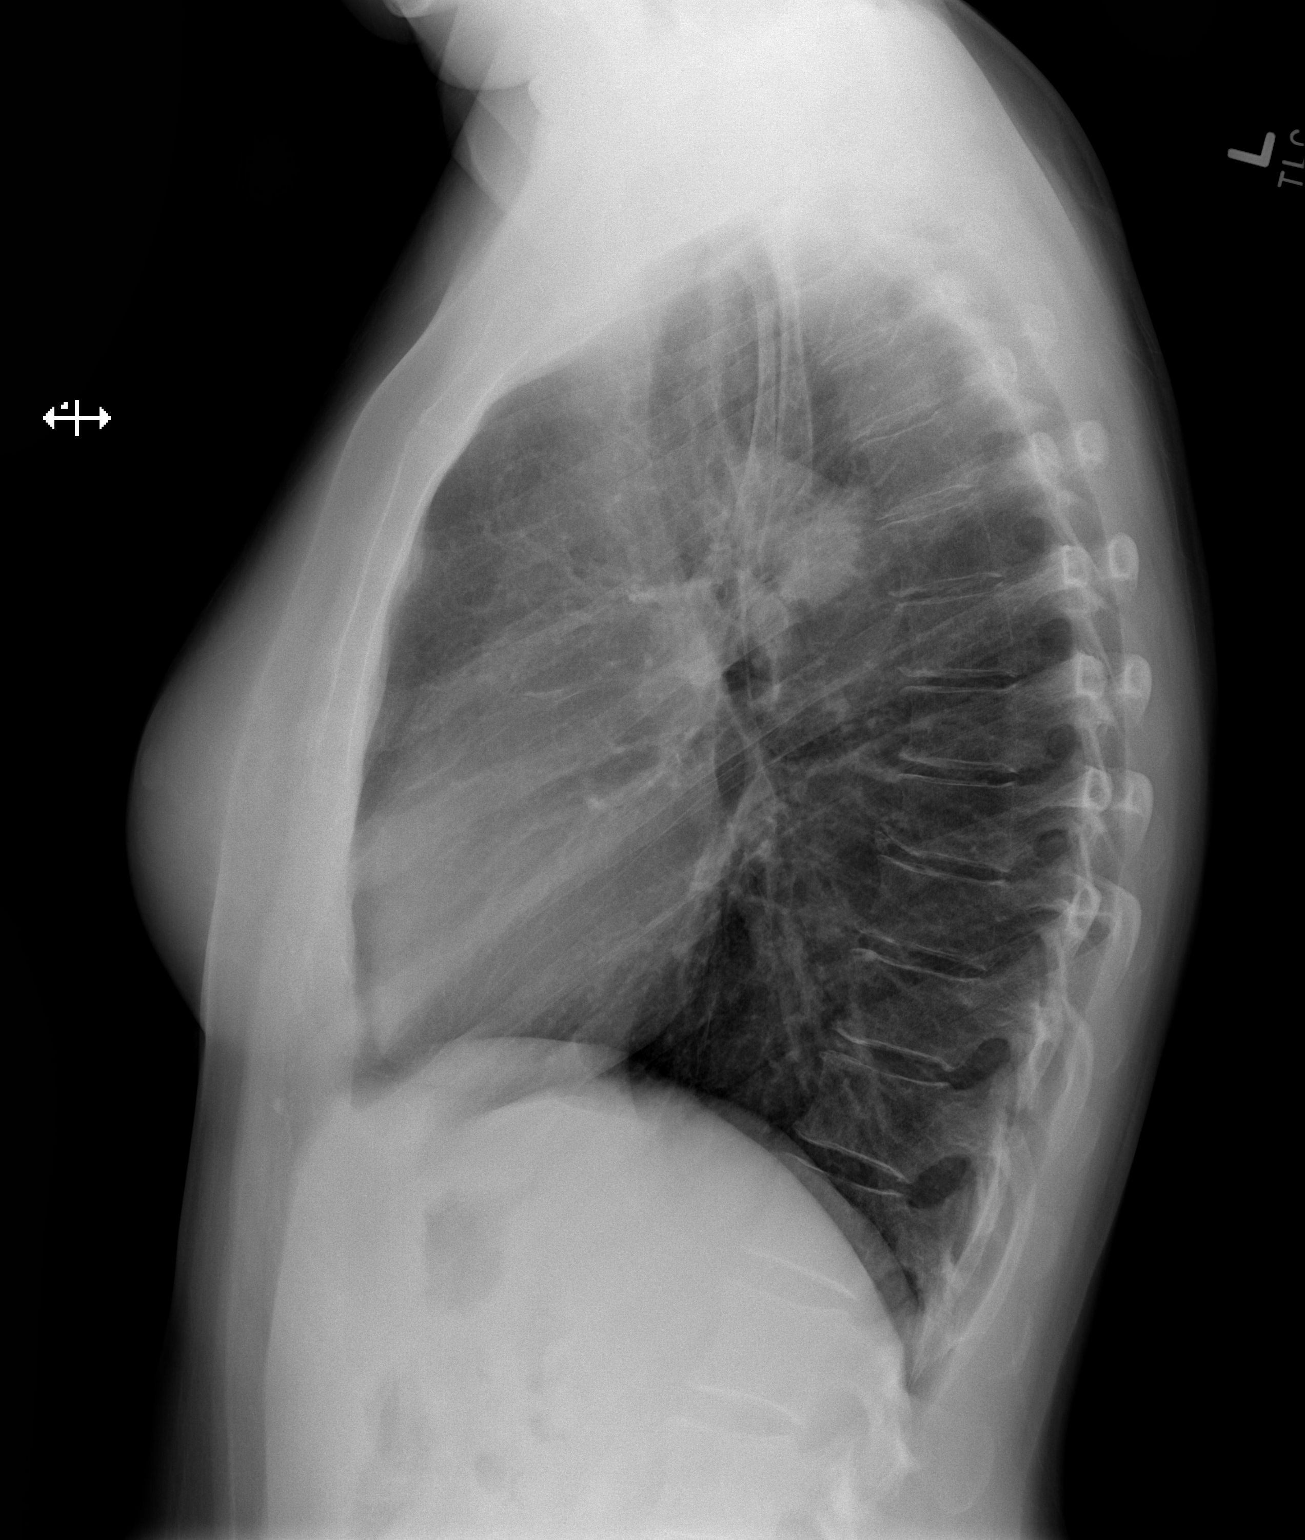

[2 of 2 positions shown; findings below may reference images not displayed]

FINDINGS: Mediastinum hilar structures normal. Left upper lung mass again
noted. No pleural effusion or pneumothorax. Heart size normal. No
acute bony abnormality.
IMPRESSION: 1.  Left upper lung mass again noted.  No interim change.

2.  No new abnormality identified.

## 2019-05-14 IMAGING — DX DG CHEST 1V PORT
1 series · 1 of 1 positions shown · non-contrast
Comparison: Radiographs March 29, 2018.

CLINICAL DATA: Status post left lung lobectomy.

EXAM:
PORTABLE CHEST 1 VIEW

[chest ap]
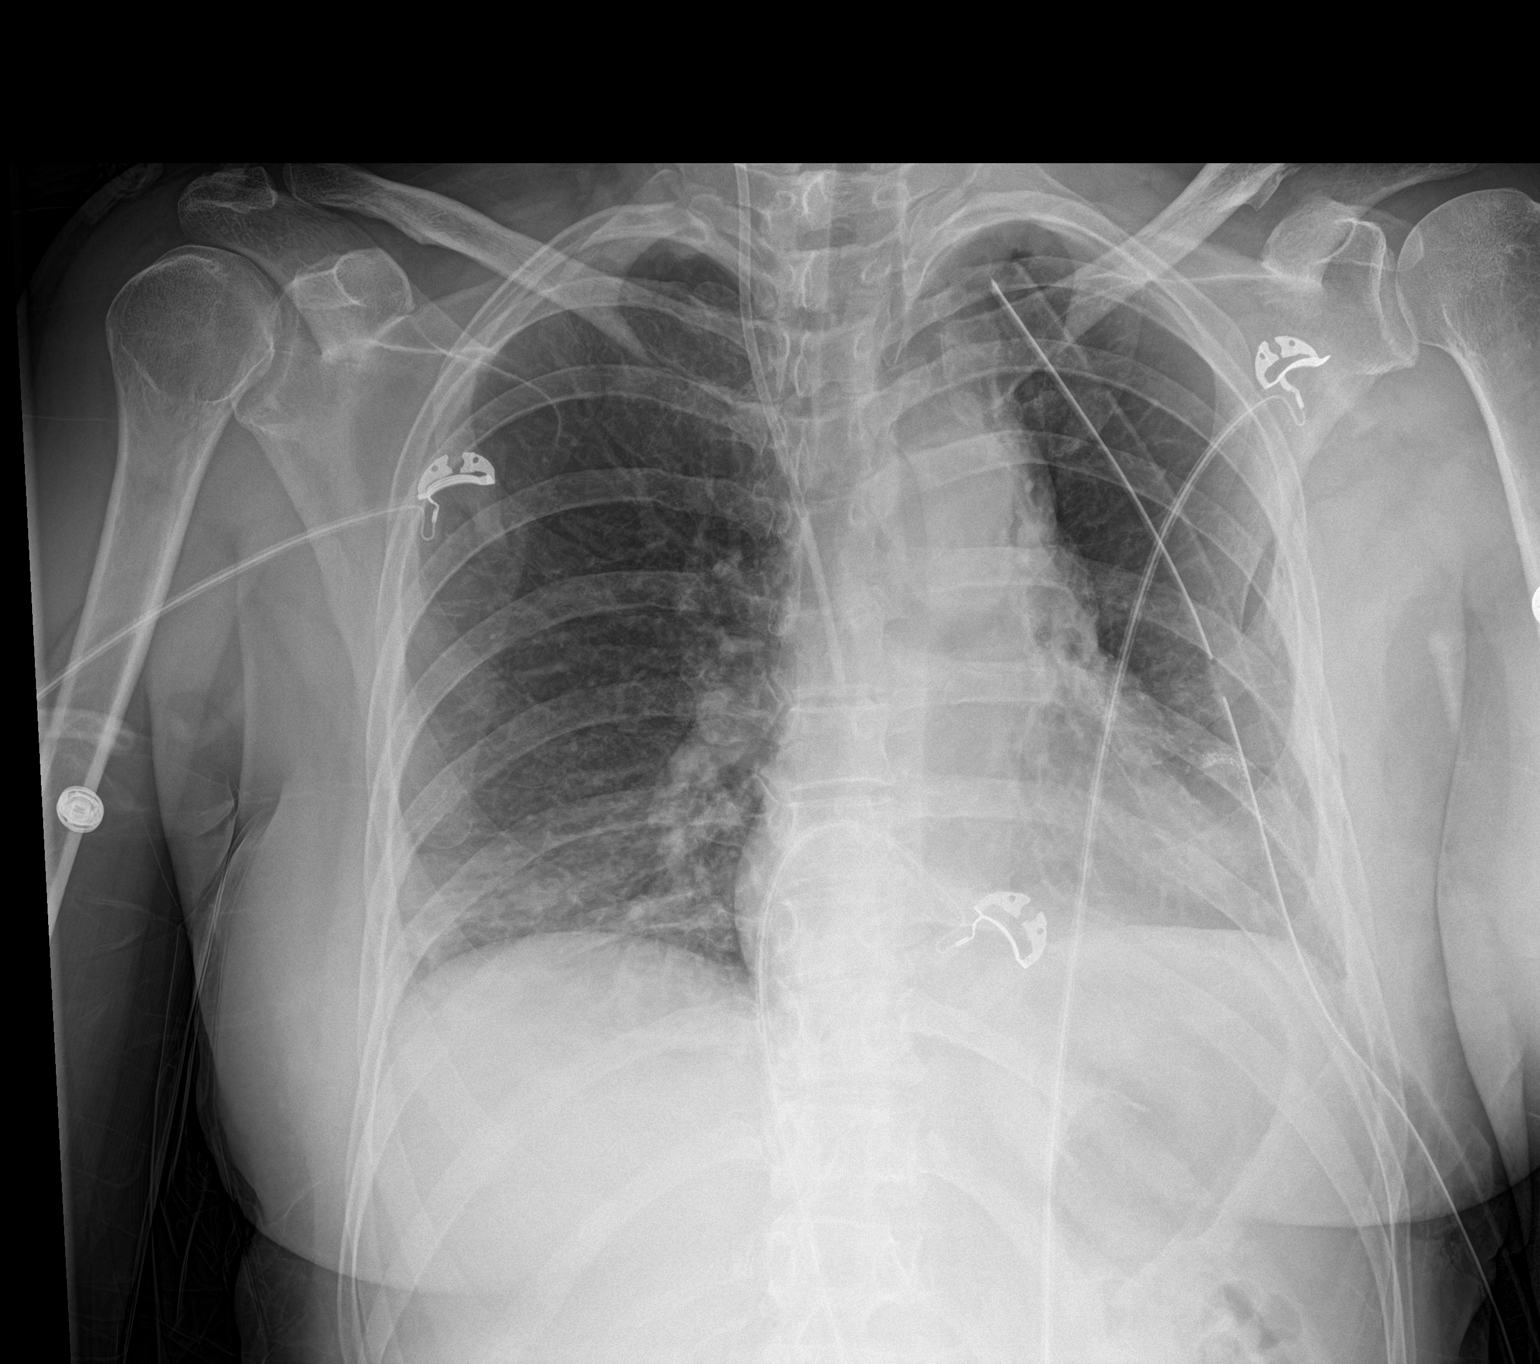

[1 of 1 positions shown; findings below may reference images not displayed]

FINDINGS: Mild mediastinal shift to the left is noted consistent with history
of left upper lobectomy. Left upper lobe mass noted on prior exam is
no longer visualized. Left-sided chest tube is noted without
definite pneumothorax. No significant pleural effusion is noted.
Mild right basilar subsegmental atelectasis is noted. Right internal
jugular catheter is noted with tip in expected position of the SVC.
Bony thorax is unremarkable.
IMPRESSION: Status post left upper lobectomy. Left-sided chest tube is noted
without pneumothorax. Mild right basilar subsegmental atelectasis is
noted.

## 2019-05-29 DIAGNOSIS — M5416 Radiculopathy, lumbar region: Secondary | ICD-10-CM | POA: Diagnosis not present

## 2019-05-29 DIAGNOSIS — I10 Essential (primary) hypertension: Secondary | ICD-10-CM | POA: Diagnosis not present

## 2019-05-29 DIAGNOSIS — M961 Postlaminectomy syndrome, not elsewhere classified: Secondary | ICD-10-CM | POA: Diagnosis not present

## 2019-05-29 DIAGNOSIS — M5137 Other intervertebral disc degeneration, lumbosacral region: Secondary | ICD-10-CM | POA: Diagnosis not present

## 2019-05-29 DIAGNOSIS — Z79899 Other long term (current) drug therapy: Secondary | ICD-10-CM | POA: Diagnosis not present

## 2019-05-31 DIAGNOSIS — F411 Generalized anxiety disorder: Secondary | ICD-10-CM | POA: Diagnosis not present

## 2019-05-31 DIAGNOSIS — Z6825 Body mass index (BMI) 25.0-25.9, adult: Secondary | ICD-10-CM | POA: Diagnosis not present

## 2019-05-31 DIAGNOSIS — K581 Irritable bowel syndrome with constipation: Secondary | ICD-10-CM | POA: Diagnosis not present

## 2019-05-31 DIAGNOSIS — I10 Essential (primary) hypertension: Secondary | ICD-10-CM | POA: Diagnosis not present

## 2019-06-01 DIAGNOSIS — I10 Essential (primary) hypertension: Secondary | ICD-10-CM | POA: Diagnosis not present

## 2019-06-01 DIAGNOSIS — K581 Irritable bowel syndrome with constipation: Secondary | ICD-10-CM | POA: Diagnosis not present

## 2019-06-25 DIAGNOSIS — M5416 Radiculopathy, lumbar region: Secondary | ICD-10-CM | POA: Diagnosis not present

## 2019-06-29 IMAGING — MR MR HEAD WO/W CM
7 of 13 series · 29 of 48 positions shown · IV contrast (gadavist)
Comparison: Head CT 02/04/2017

CLINICAL DATA: Dizziness and vision changes. Headaches and
depression for 2 months. History of lung cancer.

EXAM:
MRI HEAD WITHOUT AND WITH CONTRAST
TECHNIQUE: Multiplanar, multiecho pulse sequences of the brain and surrounding
structures were obtained without and with intravenous contrast.
CONTRAST:  6 mL Gadavist

[Series 2: DWI · axial · 3.0mm · 0.68mm/px · z∈[-66,+77]mm · 6 of 49 slices shown (1 of 4)]
[im 1/49]
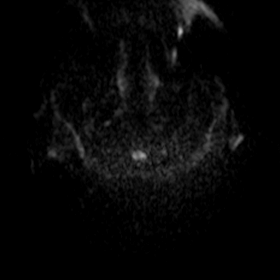
[im 10/49]
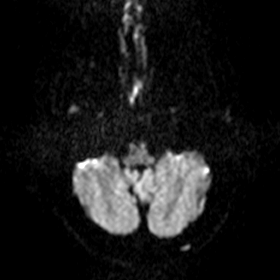
[im 20/49]
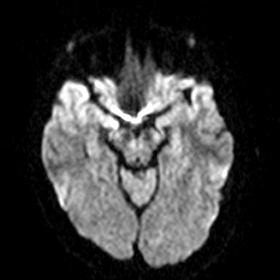
[im 29/49]
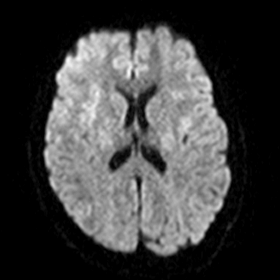
[im 39/49]
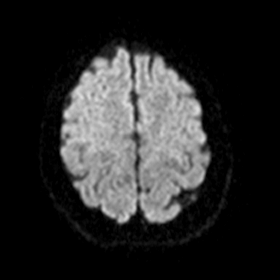
[im 49/49]
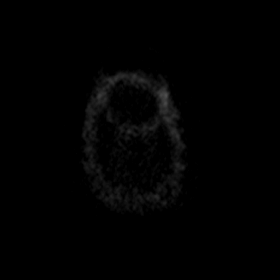

[Series 3: DWI · axial · 3.0mm · 0.71mm/px · z∈[-69,+77]mm · 5 of 50 slices shown (2 of 4)]
[im 1/50]
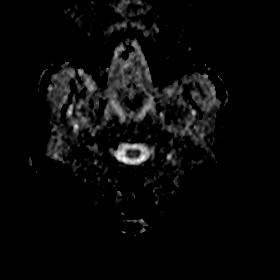
[im 13/50]
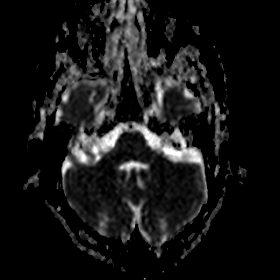
[im 25/50]
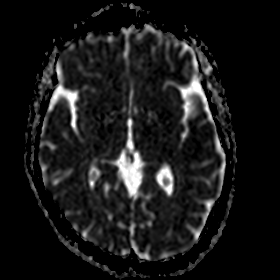
[im 37/50]
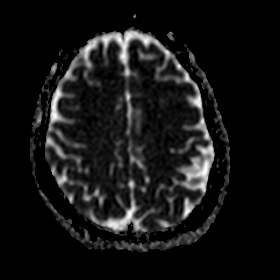
[im 50/50]
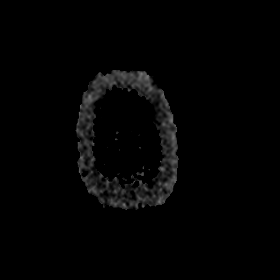

[Series 5: DWI · coronal · 5.0mm · 0.45mm/px · 3 of 35 slices shown (3 of 4)]
[im 1/35]
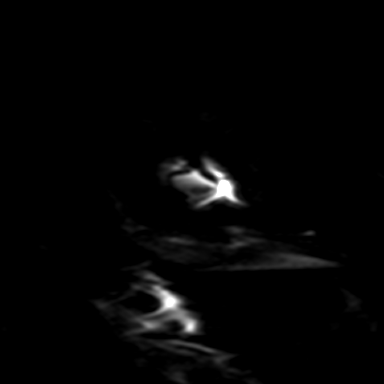
[im 18/35]
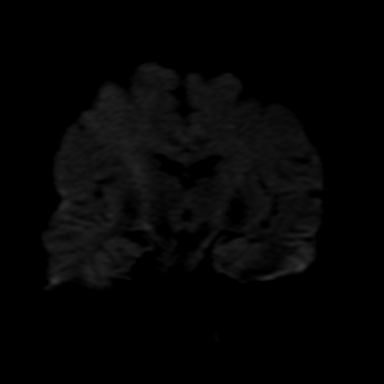
[im 35/35]
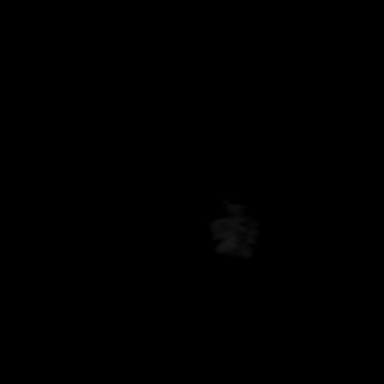

[Series 6: DWI · coronal · 5.0mm · 0.52mm/px · 3 of 34 slices shown (4 of 4)]
[im 1/34]
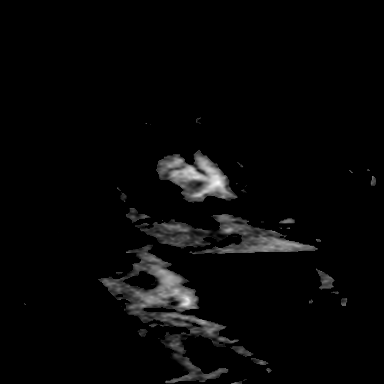
[im 17/34]
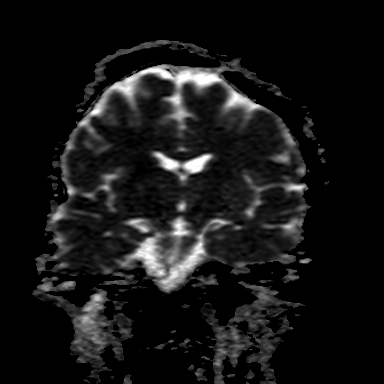
[im 34/34]
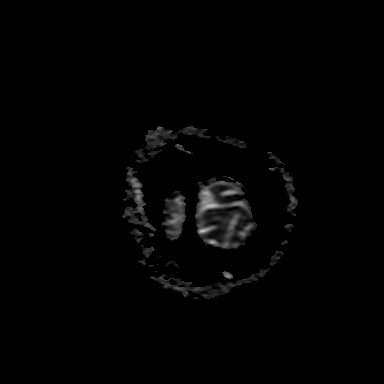

[Series 9: FLAIR · axial · 3.0mm · 0.34mm/px · z∈[-66,+71]mm · 4 of 47 slices shown]
[im 1/47]
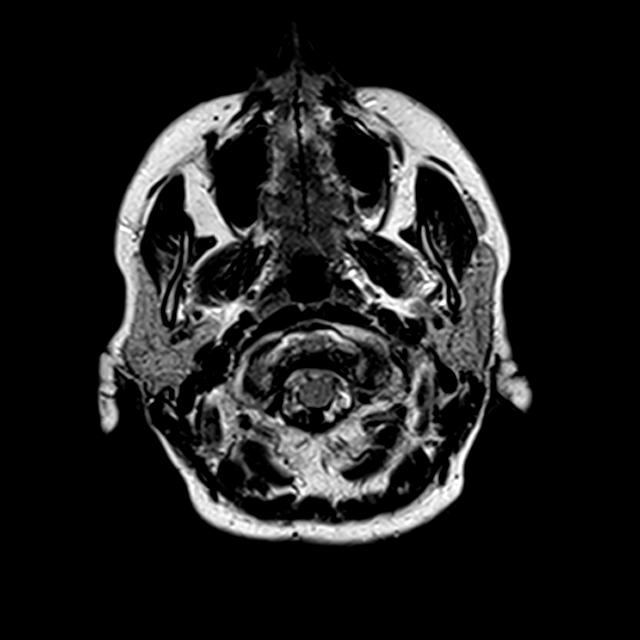
[im 16/47]
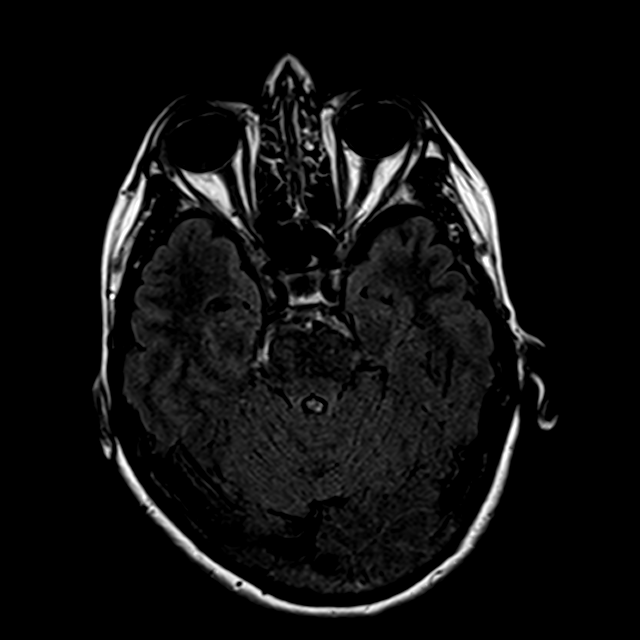
[im 31/47]
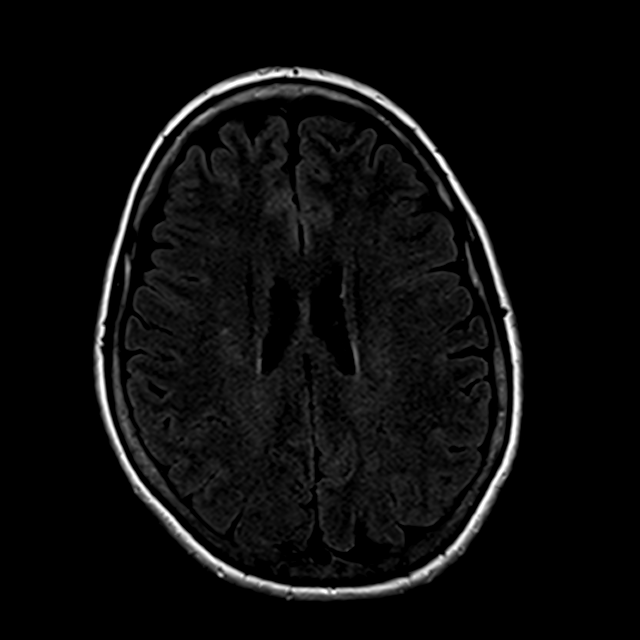
[im 47/47]
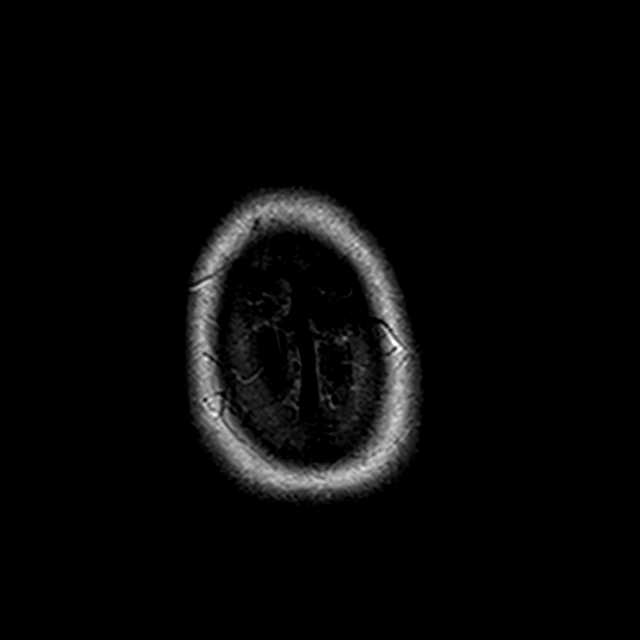

[Series 13: T1 post-contrast · axial · 2.0mm · 0.42mm/px · z∈[-70,+83]mm · 7 of 78 slices shown (1 of 2)]
[im 1/78]
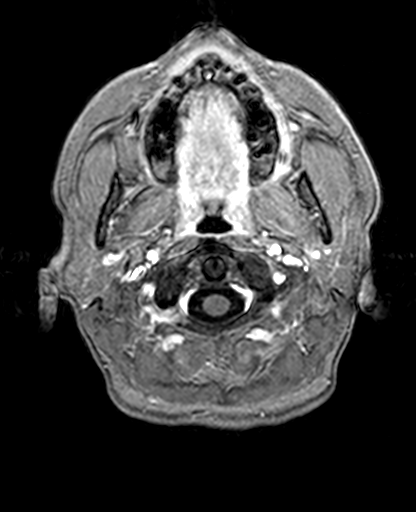
[im 13/78]
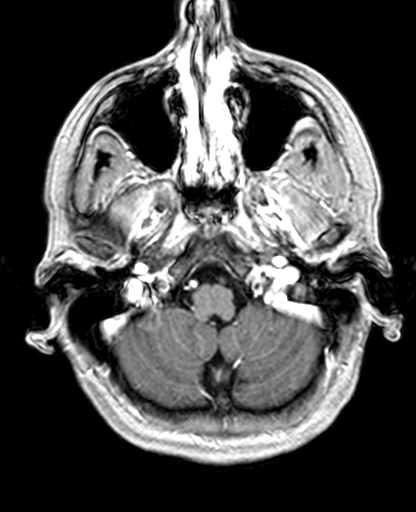
[im 26/78]
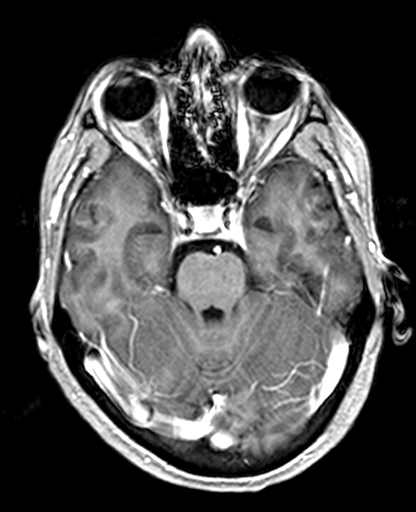
[im 39/78]
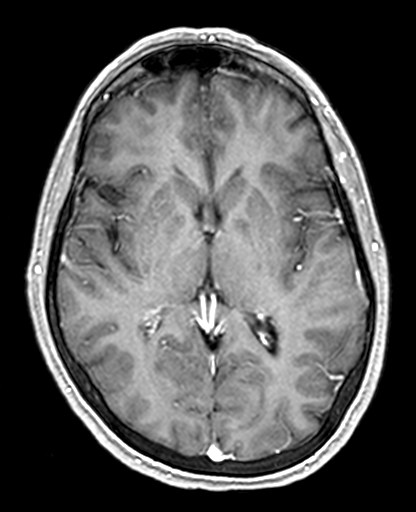
[im 52/78]
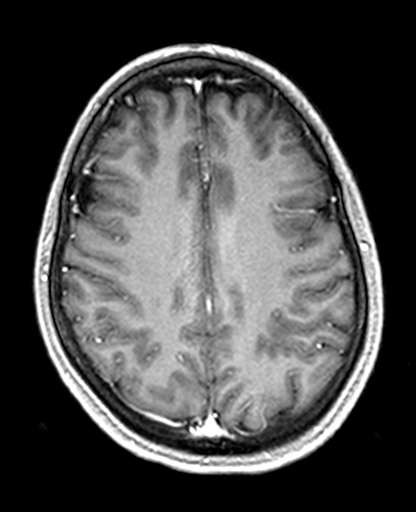
[im 65/78]
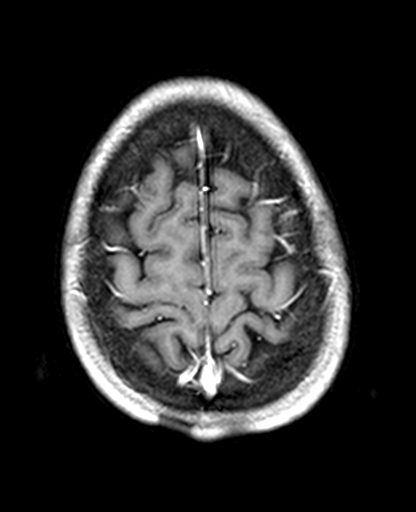
[im 78/78]
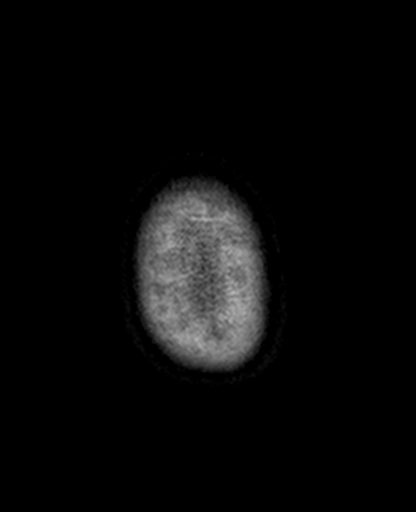

[Series 14: T1 post-contrast · coronal · 5.0mm · 0.42mm/px · 1 of 24 slices shown (2 of 2)]
[im 1/24]
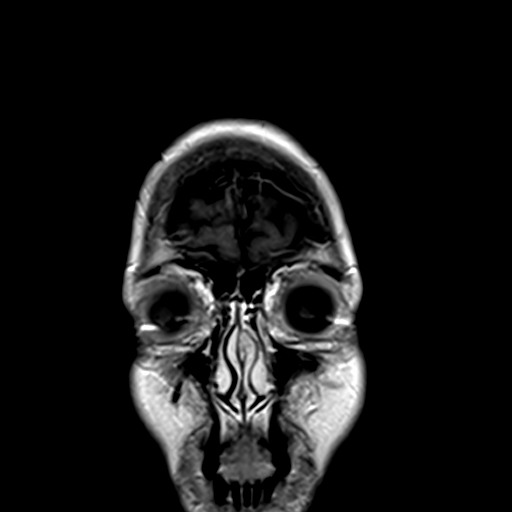

[29 of 48 positions shown; findings below may reference images not displayed]

FINDINGS: Brain: There is no evidence of acute infarct, intracranial
hemorrhage, mass, midline shift, or extra-axial fluid collection.
The ventricles and sulci are normal. The brain is normal in signal.
No abnormal enhancement is identified.

Vascular: Major intracranial vascular flow voids are preserved.

Skull and upper cervical spine: Unremarkable bone marrow signal.

Sinuses/Orbits: Unremarkable orbits. Clear paranasal sinuses. Small
left mastoid effusion.

Other: None.
IMPRESSION: Unremarkable appearance of the brain. No evidence of intracranial
metastases.

## 2019-07-23 DIAGNOSIS — Z87891 Personal history of nicotine dependence: Secondary | ICD-10-CM | POA: Diagnosis not present

## 2019-07-23 DIAGNOSIS — Z91018 Allergy to other foods: Secondary | ICD-10-CM | POA: Diagnosis not present

## 2019-07-23 DIAGNOSIS — Z85118 Personal history of other malignant neoplasm of bronchus and lung: Secondary | ICD-10-CM | POA: Diagnosis not present

## 2019-07-23 DIAGNOSIS — I10 Essential (primary) hypertension: Secondary | ICD-10-CM | POA: Diagnosis not present

## 2019-07-23 DIAGNOSIS — F411 Generalized anxiety disorder: Secondary | ICD-10-CM | POA: Diagnosis not present

## 2019-07-23 DIAGNOSIS — M545 Low back pain: Secondary | ICD-10-CM | POA: Diagnosis not present

## 2019-07-23 DIAGNOSIS — M5137 Other intervertebral disc degeneration, lumbosacral region: Secondary | ICD-10-CM | POA: Diagnosis not present

## 2019-07-23 DIAGNOSIS — M47816 Spondylosis without myelopathy or radiculopathy, lumbar region: Secondary | ICD-10-CM | POA: Diagnosis not present

## 2019-07-23 DIAGNOSIS — M5416 Radiculopathy, lumbar region: Secondary | ICD-10-CM | POA: Diagnosis not present

## 2019-07-23 DIAGNOSIS — Z20822 Contact with and (suspected) exposure to covid-19: Secondary | ICD-10-CM | POA: Diagnosis not present

## 2019-07-23 DIAGNOSIS — Z79899 Other long term (current) drug therapy: Secondary | ICD-10-CM | POA: Diagnosis not present

## 2019-07-23 DIAGNOSIS — R110 Nausea: Secondary | ICD-10-CM | POA: Diagnosis not present

## 2019-07-23 DIAGNOSIS — G8929 Other chronic pain: Secondary | ICD-10-CM | POA: Diagnosis not present

## 2019-07-23 DIAGNOSIS — Z886 Allergy status to analgesic agent status: Secondary | ICD-10-CM | POA: Diagnosis not present

## 2019-07-23 DIAGNOSIS — R1013 Epigastric pain: Secondary | ICD-10-CM | POA: Diagnosis not present

## 2019-07-24 DIAGNOSIS — K838 Other specified diseases of biliary tract: Secondary | ICD-10-CM | POA: Diagnosis not present

## 2019-07-24 DIAGNOSIS — M47816 Spondylosis without myelopathy or radiculopathy, lumbar region: Secondary | ICD-10-CM | POA: Diagnosis not present

## 2019-07-24 DIAGNOSIS — G8929 Other chronic pain: Secondary | ICD-10-CM | POA: Diagnosis not present

## 2019-07-24 DIAGNOSIS — M545 Low back pain: Secondary | ICD-10-CM | POA: Diagnosis not present

## 2019-07-24 DIAGNOSIS — R110 Nausea: Secondary | ICD-10-CM | POA: Diagnosis not present

## 2019-07-24 DIAGNOSIS — R932 Abnormal findings on diagnostic imaging of liver and biliary tract: Secondary | ICD-10-CM | POA: Diagnosis not present

## 2019-07-24 DIAGNOSIS — F411 Generalized anxiety disorder: Secondary | ICD-10-CM | POA: Diagnosis not present

## 2019-07-24 DIAGNOSIS — I10 Essential (primary) hypertension: Secondary | ICD-10-CM | POA: Diagnosis not present

## 2019-07-24 DIAGNOSIS — Z79899 Other long term (current) drug therapy: Secondary | ICD-10-CM | POA: Diagnosis not present

## 2019-07-24 DIAGNOSIS — Z85118 Personal history of other malignant neoplasm of bronchus and lung: Secondary | ICD-10-CM | POA: Diagnosis not present

## 2019-07-24 DIAGNOSIS — Z886 Allergy status to analgesic agent status: Secondary | ICD-10-CM | POA: Diagnosis not present

## 2019-07-24 DIAGNOSIS — Z87891 Personal history of nicotine dependence: Secondary | ICD-10-CM | POA: Diagnosis not present

## 2019-07-24 DIAGNOSIS — Z20822 Contact with and (suspected) exposure to covid-19: Secondary | ICD-10-CM | POA: Diagnosis not present

## 2019-07-24 DIAGNOSIS — R1013 Epigastric pain: Secondary | ICD-10-CM | POA: Diagnosis not present

## 2019-07-24 DIAGNOSIS — Z91018 Allergy to other foods: Secondary | ICD-10-CM | POA: Diagnosis not present

## 2019-07-31 DIAGNOSIS — Z6825 Body mass index (BMI) 25.0-25.9, adult: Secondary | ICD-10-CM | POA: Diagnosis not present

## 2019-07-31 DIAGNOSIS — K921 Melena: Secondary | ICD-10-CM | POA: Diagnosis not present

## 2019-09-20 DIAGNOSIS — K581 Irritable bowel syndrome with constipation: Secondary | ICD-10-CM | POA: Diagnosis not present

## 2019-09-20 DIAGNOSIS — K921 Melena: Secondary | ICD-10-CM | POA: Diagnosis not present

## 2019-09-20 DIAGNOSIS — I10 Essential (primary) hypertension: Secondary | ICD-10-CM | POA: Diagnosis not present

## 2019-09-20 DIAGNOSIS — F411 Generalized anxiety disorder: Secondary | ICD-10-CM | POA: Diagnosis not present

## 2019-09-20 DIAGNOSIS — C3411 Malignant neoplasm of upper lobe, right bronchus or lung: Secondary | ICD-10-CM | POA: Diagnosis not present

## 2019-10-09 DIAGNOSIS — M5416 Radiculopathy, lumbar region: Secondary | ICD-10-CM | POA: Diagnosis not present

## 2019-10-09 DIAGNOSIS — M5137 Other intervertebral disc degeneration, lumbosacral region: Secondary | ICD-10-CM | POA: Diagnosis not present

## 2019-12-25 ENCOUNTER — Ambulatory Visit (INDEPENDENT_AMBULATORY_CARE_PROVIDER_SITE_OTHER): Payer: BC Managed Care – PPO | Admitting: Internal Medicine

## 2020-01-22 ENCOUNTER — Ambulatory Visit (INDEPENDENT_AMBULATORY_CARE_PROVIDER_SITE_OTHER): Payer: 59 | Admitting: Internal Medicine

## 2020-01-22 ENCOUNTER — Telehealth (INDEPENDENT_AMBULATORY_CARE_PROVIDER_SITE_OTHER): Payer: Self-pay | Admitting: Internal Medicine

## 2020-01-22 ENCOUNTER — Encounter (INDEPENDENT_AMBULATORY_CARE_PROVIDER_SITE_OTHER): Payer: Self-pay | Admitting: Internal Medicine

## 2020-01-22 ENCOUNTER — Other Ambulatory Visit: Payer: Self-pay

## 2020-01-22 VITALS — BP 159/93 | HR 86 | Temp 99.3°F | Ht 65.5 in | Wt 147.6 lb

## 2020-01-22 DIAGNOSIS — K5900 Constipation, unspecified: Secondary | ICD-10-CM | POA: Insufficient documentation

## 2020-01-22 DIAGNOSIS — K51211 Ulcerative (chronic) proctitis with rectal bleeding: Secondary | ICD-10-CM | POA: Diagnosis not present

## 2020-01-22 DIAGNOSIS — K5903 Drug induced constipation: Secondary | ICD-10-CM

## 2020-01-22 MED ORDER — PREDNISONE 10 MG PO TABS: 30.0000 mg | ORAL_TABLET | Freq: Every day | ORAL | 0 refills | Status: DC

## 2020-01-22 MED ORDER — MESALAMINE ER 0.375 G PO CP24: 1500.0000 mg | ORAL_CAPSULE | Freq: Every day | ORAL | 5 refills | Status: DC

## 2020-01-22 MED ORDER — POLYETHYLENE GLYCOL 3350 17 GM/SCOOP PO POWD: 8.5000 g | Freq: Every day | ORAL | 0 refills | Status: AC

## 2020-01-22 NOTE — Progress Notes (Signed)
Presenting complaint;  Rectal bleeding. History of ulcerative proctitis.  Database and subjective:  Patient is 54 year old Caucasian female who underwent diagnostic colonoscopy in March last year and was found to have ulcerative proctitis.  She was initially treated with mesalamine suppositories but without any benefit.  She was then treated with hydrocortisone enemas and once again rectal bleeding continued. She underwent flexible sigmoidoscopy in August 2020 once again revealing active disease involving the rectum and there was no evidence of superadded CMV infection.  Patient was begun on prednisone and oral mesalamine.  She was supposed to come back for follow-up visit in 1 month but she did not call or return for a visit. She states she followed up with Dr. Sherrie Sport who treated her with sulfasalazine which she took for few months but really could not tell any difference.  If anything she feels she had more bleeding while she was on sulfasalazine.  She is passing blood per rectum every day.  She goes to the bathroom 14-15 times in most of the time while she passes blood and clots and mucus.  Even though she goes to the bathroom multiple times her urge never goes away.  She is having hard stool every day.  She has good appetite.  She has not lost any weight since her last office visit of March 2020. She has chronic low back pain.  She takes pain medication about 3 times per day.  She has had 2 lumbar spine surgeries and she feels she may need another surgery. She does not take OTC NSAIDs. She quit cigarette smoking in November 2019.  She she has received both doses of Covid vaccine made by [facility].  Patient reports extreme stress.  She says not only she works 6 days a week she also helps care for a number of family members including her husband who is on disability with [place].  Family history significant for ulcerative colitis in her father who was diagnosed at  age 41 or 43.  Her brother also has chronic ulcerative colitis and she tells me he is having surgery today for colon cancer.  Current Medications: Outpatient Encounter Medications as of 01/22/2020  Medication Sig  . acetaminophen (TYLENOL) 500 MG tablet Take 2 tablets (1,000 mg total) by mouth every 6 (six) hours.  Marland Kitchen albuterol (PROVENTIL HFA;VENTOLIN HFA) 108 (90 Base) MCG/ACT inhaler Inhale 1-2 puffs into the lungs every 4 (four) hours as needed for wheezing or shortness of breath.  . busPIRone (BUSPAR) 15 MG tablet Take 15 mg by mouth 2 (two) times daily.  Marland Kitchen oxyCODONE-acetaminophen (PERCOCET) 10-325 MG tablet Take 1 tablet by mouth every 6 (six) hours as needed for pain.  Marland Kitchen tiZANidine (ZANAFLEX) 4 MG tablet Take 4 mg by mouth every 8 (eight) hours as needed for muscle spasms.   . verapamil (CALAN) 120 MG tablet Take 120 mg by mouth once.  . vitamin C (ASCORBIC ACID) 500 MG tablet Take 500 mg by mouth daily.   . [DISCONTINUED] levothyroxine (SYNTHROID, LEVOTHROID) 25 MCG tablet Take 25 mcg by mouth daily before breakfast. (Patient not taking: Reported on 01/22/2020)  . [DISCONTINUED] linaCLOtide (LINZESS PO) Take by mouth daily. (Patient not taking: Reported on 01/22/2020)  . [DISCONTINUED] mesalamine (LIALDA) 1.2 g EC tablet Take 2 tablets (2.4 g total) by mouth 2 (two) times daily. (Patient not taking: Reported on 01/22/2020)  . [DISCONTINUED] predniSONE (DELTASONE) 10 MG tablet Take 3 tablets (30 mg total) by mouth daily with breakfast. Drop  dose by 5 mg every 5 days. (Patient not taking: Reported on 04/12/2019)   No facility-administered encounter medications on file as of 01/22/2020.     Objective: Blood pressure (!) 159/93, pulse 86, temperature 99.3 F (37.4 C), temperature source Oral, height 5' 5.5" (1.664 m), weight 147 lb 9.6 oz (67 kg), last menstrual period 03/30/2007. Patient is alert and in no acute distress. Patient is wearing a mask. Conjunctiva is pink. Sclera is  nonicteric Oropharyngeal mucosa is normal. No neck masses or thyromegaly noted. Cardiac exam with regular rhythm normal S1 and S2. No murmur or gallop noted. Lungs are clear to auscultation. Abdomen is soft and nontender with no organomegaly or masses. No LE edema or clubbing noted.  Labs/studies Results:  CBC Latest Ref Rng & Units 10/26/2018 07/12/2018 04/02/2018  WBC 3.8 - 10.8 Thousand/uL 5.3 5.5 9.3  Hemoglobin 11.7 - 15.5 g/dL 13.2 12.9 10.5(L)  Hematocrit 35 - 45 % 39.8 38.6 33.3(L)  Platelets 140 - 400 Thousand/uL 329 257 226    CMP Latest Ref Rng & Units 04/02/2018 04/01/2018 03/29/2018  Glucose 70 - 99 mg/dL 103(H) 147(H) 107(H)  BUN 6 - 20 mg/dL 7 9 12   Creatinine 0.44 - 1.00 mg/dL 0.53 0.69 0.65  Sodium 135 - 145 mmol/L 141 139 142  Potassium 3.5 - 5.1 mmol/L 3.9 4.1 3.7  Chloride 98 - 111 mmol/L 108 108 106  CO2 22 - 32 mmol/L 28 23 25   Calcium 8.9 - 10.3 mg/dL 8.4(L) 8.4(L) 9.3  Total Protein 6.5 - 8.1 g/dL 5.4(L) - 7.1  Total Bilirubin 0.3 - 1.2 mg/dL 0.6 - 0.3  Alkaline Phos 38 - 126 U/L 67 - 86  AST 15 - 41 U/L 19 - 26  ALT 0 - 44 U/L 18 - 25    Hepatic Function Latest Ref Rng & Units 04/02/2018 03/29/2018  Total Protein 6.5 - 8.1 g/dL 5.4(L) 7.1  Albumin 3.5 - 5.0 g/dL 2.9(L) 4.0  AST 15 - 41 U/L 19 26  ALT 0 - 44 U/L 18 25  Alk Phosphatase 38 - 126 U/L 67 86  Total Bilirubin 0.3 - 1.2 mg/dL 0.6 0.3     Assessment:  #1.  Ulcerative proctitis.  She did not respond to topical mesalamine or hydrocortisone as well as sulfasalazine which she may not have taken long enough.  She is having daily rectal bleeding and she also has tenesmus.  If she fails standard therapy she may need biologic.  #2.  Chronic constipation secondary to pain medication.  Plan:  Patient will go to the lab for CBC with differential, comprehensive chemistry panel and CRP. Begin prednisone 30 mg by mouth daily with breakfast for 1 week and thereafter drop dose by 5 mg every  week. Apriso/mesalamine 1500 mg by mouth daily. Polyethylene glycol 8.5 g p.o. daily.  She can titrate the dose up to 17 g daily if she needs to. Patient advised to keep symptom diary and return for office visit in 1 month.

## 2020-01-22 NOTE — Patient Instructions (Signed)
Prednisone schedule as follows 30 mg daily with breakfast for 1 week 25 mg daily with breakfast for 1 week 20 mg daily with breakfast for 1 week 15 mg daily with breakfast for 1 week 10 mg  daily with breakfast for 1 week 5 mg daily with breakfast for 1 week and stop  Please decrease intake of red meat as discussed. Physician will call with results of blood test. Daily symptom diary until office visit in 1 month.

## 2020-01-22 NOTE — Telephone Encounter (Signed)
Patient left message stated she saw Dr Laural Golden today - wanted to let him know her insurance will not cover the mesalamine - would like to try something else - please advise - ph# [phone]

## 2020-01-22 NOTE — Telephone Encounter (Signed)
Patient was called and ask that she call her [employer] and ask which Mesalamine they will cover. This is to avoid [place] calling in another that may not be covered.  When she finds out , she is to call our office with the names or names and then Dr.Rehman will make a decision as to which is best for her. Patient agrees to do this.

## 2020-01-23 LAB — CBC WITH DIFFERENTIAL/PLATELET
Absolute Monocytes: 515 cells/uL (ref 200–950)
Basophils Absolute: 20 cells/uL (ref 0–200)
Basophils Relative: 0.4 %
Eosinophils Absolute: 82 cells/uL (ref 15–500)
Eosinophils Relative: 1.6 %
HCT: 38.1 % (ref 35.0–45.0)
Hemoglobin: 12.9 g/dL (ref 11.7–15.5)
Lymphs Abs: 1418 cells/uL (ref 850–3900)
MCH: 31.9 pg (ref 27.0–33.0)
MCHC: 33.9 g/dL (ref 32.0–36.0)
MCV: 94.1 fL (ref 80.0–100.0)
MPV: 10.3 fL (ref 7.5–12.5)
Monocytes Relative: 10.1 %
Neutro Abs: 3065 cells/uL (ref 1500–7800)
Neutrophils Relative %: 60.1 %
Platelets: 284 10*3/uL (ref 140–400)
RBC: 4.05 10*6/uL (ref 3.80–5.10)
RDW: 12.2 % (ref 11.0–15.0)
Total Lymphocyte: 27.8 %
WBC: 5.1 10*3/uL (ref 3.8–10.8)

## 2020-01-23 LAB — COMPREHENSIVE METABOLIC PANEL
AG Ratio: 2 (calc) (ref 1.0–2.5)
ALT: 13 U/L (ref 6–29)
AST: 14 U/L (ref 10–35)
Albumin: 4.5 g/dL (ref 3.6–5.1)
Alkaline phosphatase (APISO): 87 U/L (ref 37–153)
BUN: 19 mg/dL (ref 7–25)
CO2: 27 mmol/L (ref 20–32)
Calcium: 9.5 mg/dL (ref 8.6–10.4)
Chloride: 107 mmol/L (ref 98–110)
Creat: 0.72 mg/dL (ref 0.50–1.05)
Globulin: 2.2 g/dL (calc) (ref 1.9–3.7)
Glucose, Bld: 98 mg/dL (ref 65–139)
Potassium: 4.7 mmol/L (ref 3.5–5.3)
Sodium: 140 mmol/L (ref 135–146)
Total Bilirubin: 0.4 mg/dL (ref 0.2–1.2)
Total Protein: 6.7 g/dL (ref 6.1–8.1)

## 2020-01-23 LAB — C-REACTIVE PROTEIN: CRP: 2.1 mg/L (ref <8.0)

## 2020-02-04 ENCOUNTER — Telehealth (INDEPENDENT_AMBULATORY_CARE_PROVIDER_SITE_OTHER): Payer: Self-pay | Admitting: Gastroenterology

## 2020-02-04 NOTE — Telephone Encounter (Signed)
Patient was called and a message was left. Asking her to call our office.  Need to ask how long she was on the prednisone. She was advised in the message that Dr.Rehman would be in the office tomorrow and this would be addressed with him and also her lab results would be addressed.

## 2020-02-04 NOTE — Telephone Encounter (Signed)
Patient left voice mail message stating she can not take prednisone anymore-makes her feel too bad - also stated she would like lab results - ph# [phone]

## 2020-02-04 NOTE — Telephone Encounter (Signed)
Patient was seen in the office 01/22/2020, Below is Dr.Rehman's instructions.  Return in about 1 month (around 02/21/2020). Prednisone schedule as follows 30 mg daily with breakfast for 1 week 25 mg daily with breakfast for 1 week 20 mg daily with breakfast for 1 week 15 mg daily with breakfast for 1 week 10 mg  daily with breakfast for 1 week 5 mg daily with breakfast for 1 week and stop   Please decrease intake of red meat as discussed. Physician will call with results of blood test. Daily symptom diary until office visit in 1 month.      Patient will be called to see where she is in the taper.

## 2020-02-06 NOTE — Telephone Encounter (Signed)
This was discussed with Dr.Rehman and he was aware that a message had been left for the patient to call to let us know where she was in taking the Prednisone when she had to stop it.  Dr.Rehman wanted to see if the patient felt she could tolerate taking a lower dose of the medication 15 -10 mg daily until her office visit on November 1 with our PA.   This message was left on her voice mail and we ask for a call back.

## 2020-02-20 ENCOUNTER — Telehealth (INDEPENDENT_AMBULATORY_CARE_PROVIDER_SITE_OTHER): Payer: Self-pay

## 2020-02-20 NOTE — Telephone Encounter (Signed)
Yolanda [name] returned your call, she left a voicemail stating that she would try a low dose of the Prednisone and see how she does with it

## 2020-02-20 NOTE — Telephone Encounter (Signed)
Noted.  Will see patient tomorrow.

## 2020-02-20 NOTE — Progress Notes (Signed)
Patient profile: Yolanda [name] is a 54 y.o. female seen for follow up.   History of Present Illness: Yolanda [name] is seen today for follow up of Ulcerative proctitis diagnosed 06/2018. Initially treated with mesalamine suppositories without improvement in symptoms then hydrocortisone enemas but bleeding continued. Flex sig August 2020 with continued active disease in the rectum. Started on prednisone and mesalamine. She was seen by her primary care and treated with sulfasalazine for a few months but did not have improvement and symptoms possibly were worse.  She was seen for follow-up in our clinic September 2021 by Dr. Laural Golden and reported continued issues with bleeding. She was started on a prednisone taper as well as Apriso. Since that visit she was also seen in the emergency room for chest pain on February 05, 2020.  She called yesterday for acute follow-up for continued symptoms.  She feels she could not take prednisone - this made her very ill and feels it made the diarrhea worse. She is still taking the Apriso 1.5/day. She reports hx of this SE w/ prednisone in past and has been generally unable to tolerate.  She reports having 12+ BM a day - a lot of times this is just blood and mucus. Actual stool consistency can vary from formed stools to liquid stools with the rectal bleeding. Some lower abd soreness - worse over past few days - eases off some when moves stools. Reports a lot bloating.  Endorses tenesmus but denies nocturnal stooling.  She denies n/v. Reports GERD symptoms despite omeprazole 84m-this was started after ED visit for chest pain 02/05/20. She feels omeprazole 246mhas helped but not resolved symptoms. No dysphagia. Appetite good but gets a lot of urgency after meals.   Wt Readings from Last 3 Encounters:  02/21/20 147 lb 9.6 oz (67 kg)  01/22/20 147 lb 9.6 oz (67 kg)  04/12/19 147 lb (66.7 kg)     Last flex sig: 11/2018 - The sigmoid colon is normal.  Biopsied. - Proctitis. Inflammation was found from the anus to the rectum. This was moderate in severity, worsened compared to previous examinations. Biopsied.    Past Medical History:  Past Medical History:  Diagnosis Date  . Anxiety   . Cancer ([facility]  . Chronic pain   . COPD (chronic obstructive pulmonary disease) ([facility]  . Depression   . Heart murmur   . Hemorrhoids    bleed frequently  . History of kidney stones   . Mitral valve prolapse   . Pneumonia   . Seizures ([facility] [place]  . Thyroid disease     Problem List: Patient Active Problem List   Diagnosis Date Noted  . Constipation 01/22/2020  . Ulcerative proctitis ([facility]08/01/2019  . Rectal bleeding 07/11/2018  . Adenocarcinoma of left lung, stage 1 (HCErin01/14/2020  . S/P lobectomy of lung 03/31/2018    Past Surgical History: Past Surgical History:  Procedure Laterality Date  . BACK SURGERY    . BIOPSY  07/14/2018   Procedure: BIOPSY;  Surgeon: ReRogene HoustonMD;  [facility];  Service: Endoscopy;;  rectum  . BIOPSY  12/05/2018   Procedure: BIOPSY;  Surgeon: ReRogene HoustonMD;  [facility];  Service: Endoscopy;;  . [facility] COLONOSCOPY WITH PROPOFOL N/A 07/14/2018   Procedure: COLONOSCOPY WITH PROPOFOL;  Surgeon: ReRogene HoustonMD;  [facility];  Service: Endoscopy;  Laterality:  N/A;  2:20  . FLEXIBLE SIGMOIDOSCOPY N/A 12/05/2018   Procedure: FLEXIBLE SIGMOIDOSCOPY WITH PROPOFOL;  Surgeon: Rogene Houston, MD;  [facility];  Service: Endoscopy;  Laterality: N/A;  . POLYPECTOMY  07/14/2018   Procedure: POLYPECTOMY;  Surgeon: Rogene Houston, MD;  [facility];  Service: Endoscopy;;  rectum  . VIDEO ASSISTED THORACOSCOPY (VATS)/ LOBECTOMY Left 03/31/2018   Procedure: VIDEO ASSISTED THORACOSCOPY (VATS)/LEFT UPPER LOBECTOMY;  Surgeon: [name], MD;  Location: [facility];  Service: Thoracic;   Laterality: Left;  . WISDOM TOOTH EXTRACTION      Allergies: Allergies  Allergen Reactions  . Coconut Oil Swelling    Anything coconut  . Codeine Rash      Home Medications:  Current Outpatient Medications:  .  acetaminophen (TYLENOL) 500 MG tablet, Take 2 tablets (1,000 mg total) by mouth every 6 (six) hours., Disp: 30 tablet, Rfl: 0 .  albuterol (PROVENTIL HFA;VENTOLIN HFA) 108 (90 Base) MCG/ACT inhaler, Inhale 1-2 puffs into the lungs every 4 (four) hours as needed for wheezing or shortness of breath., Disp: , Rfl:  .  busPIRone (BUSPAR) 15 MG tablet, Take 15 mg by mouth 2 (two) times daily., Disp: , Rfl:  .  mesalamine (APRISO) 0.375 g 24 hr capsule, Take 4 capsules (1.5 g total) by mouth daily., Disp: 120 capsule, Rfl: 5 .  oxyCODONE-acetaminophen (PERCOCET) 10-325 MG tablet, Take 1 tablet by mouth every 6 (six) hours as needed for pain., Disp: , Rfl:  .  polyethylene glycol powder (GLYCOLAX/MIRALAX) 17 GM/SCOOP powder, Take 8.5 g by mouth daily., Disp: 255 g, Rfl: 0 .  tiZANidine (ZANAFLEX) 4 MG tablet, Take 4 mg by mouth every 8 (eight) hours as needed for muscle spasms. , Disp: , Rfl:  .  verapamil (CALAN) 120 MG tablet, Take 120 mg by mouth once., Disp: , Rfl:  .  vitamin C (ASCORBIC ACID) 500 MG tablet, Take 500 mg by mouth daily. , Disp: , Rfl:  .  dicyclomine (BENTYL) 10 MG capsule, Take 1 capsule (10 mg total) by mouth 3 (three) times daily as needed for spasms., Disp: 90 capsule, Rfl: 1 .  omeprazole (PRILOSEC) 40 MG capsule, Take 1 capsule (40 mg total) by mouth daily., Disp: 30 capsule, Rfl: 3   Family History: family history includes Alcoholism in her sister; Breast cancer in her mother; Clotting disorder in her paternal aunt; Colitis in her father; Colon cancer in her brother; Depression in her sister; Healthy in her brother, daughter, and son; Iron deficiency in her father.    Social History:   reports that she quit smoking about 2 years ago. Her smoking use included  cigarettes. She has a 35.00 pack-year smoking history. She has never used smokeless tobacco. She reports that she does not drink alcohol and does not use drugs.   Review of Systems: Constitutional: Denies weight loss/weight gain  Eyes: No changes in vision. ENT: No oral lesions, sore throat.  GI: see HPI.  Heme/Lymph: No easy bruising.  CV: No chest pain.  GU: No hematuria.  Integumentary: No rashes.  Neuro: No headaches.  Psych: No depression/anxiety.  Endocrine: No heat/cold intolerance.  Allergic/Immunologic: No urticaria.  Resp: No cough, SOB.  Musculoskeletal: No joint swelling.    Physical Examination: BP 136/87 (BP Location: Left Arm, Patient Position: Sitting, Cuff Size: Normal)   Pulse 97   Temp 99.6 F (37.6 C) (Oral)   Ht 5' 5.5" (1.664 m)   Wt 147 lb 9.6 oz (67  kg)   LMP 03/30/2007   BMI 24.19 kg/m  Gen: NAD, alert and oriented x 4 HEENT: PEERLA, EOMI, Neck: supple, no JVD Chest: CTA bilaterally, no wheezes, crackles, or other adventitious sounds CV: RRR, no m/g/c/r Abd: soft, NT, ND, +BS in all four quadrants; no HSM, guarding, ridigity, or rebound tenderness Ext: no edema, well perfused with 2+ pulses, Skin: no rash or lesions noted on observed skin Lymph: no noted LAD  Data Reviewed:  01/22/20 - CBC, CMP CMP normal  02/05/20-CBC w/ hgb 12.2, CMP normal, INR norma.l  Assessment/Plan: Ms. Thebeau is a 54 y.o. female seen for flare of ulcerative colitis  1.  UC-she is on mesalamine and has been unable to tolerate prednisone for flare-like symptoms. CBC CMP and CRP 1 month ago were normal and she feels symptoms are about the same she will hold off on repeating.  No recent stool studies and she does work in home health care so we will check these to exclude underlying infection.  If stool studies negative would consider course of Uceris which may have less systemic side effects than the prednisone, if she is unable to tolerate Uceris would consider repeat  endoscopic evaluation to restage disease prior to possibly changing maintenance therapy.  We will give dicyclomine to use in interim for symptoms.  2.  GERD-seen in ED for chest pain felt to be related to GERD.  Some improvement with omeprazole 20 mg daily, will try 40 mg once a day. Consider EGD w/ next flex sig/colonoscopy.    Yolanda [name] was seen today for rectal bleeding.  Diagnoses and all orders for this visit:  Ulcerative proctitis with rectal bleeding (HCC) -     C. difficile GDH and Toxin A/B -     Gastrointestinal Pathogen Panel PCR  Rectal bleeding  Diarrhea, unspecified type -     C. difficile GDH and Toxin A/B -     Gastrointestinal Pathogen Panel PCR  Other orders -     dicyclomine (BENTYL) 10 MG capsule; Take 1 capsule (10 mg total) by mouth 3 (three) times daily as needed for spasms. -     omeprazole (PRILOSEC) 40 MG capsule; Take 1 capsule (40 mg total) by mouth daily.    Further recs pending stool study results.   I personally performed the service, non-incident to. (WP)  Laurine Blazer, [facility]

## 2020-02-20 NOTE — Telephone Encounter (Signed)
Talked with the patient. She states that she just got the message(s) from her cell phone. So after leaving this message she retrieved the following message on February 06 2020.   This was discussed with Dr.Rehman and he was aware that a message had been left for the patient to call to let us know where she was in taking the Prednisone when she had to stop it.   Dr.Rehman wanted to see if the patient felt she could tolerate taking a lower dose of the medication 15 -10 mg daily until her office visit on November 1 with our PA.    This message was left on her voice mail and we ask for a call back.   Patient states that she cannot take the Prednisone, as it makes her very irritable , mean, feels funny. She states that she is taking her Mesalamine as directed , the rectal bleeding is with every BM , and she feels a lot, feels tired.  Patient had appointment for 02/25/2020 to see Thayer Headings, however there was a 9 am for tomorrow and she has been moved to that appointment.  Thayer Headings - this is FYI.

## 2020-02-21 ENCOUNTER — Other Ambulatory Visit: Payer: Self-pay

## 2020-02-21 ENCOUNTER — Encounter (INDEPENDENT_AMBULATORY_CARE_PROVIDER_SITE_OTHER): Payer: Self-pay | Admitting: Gastroenterology

## 2020-02-21 ENCOUNTER — Ambulatory Visit (INDEPENDENT_AMBULATORY_CARE_PROVIDER_SITE_OTHER): Payer: 59 | Admitting: Gastroenterology

## 2020-02-21 VITALS — BP 136/87 | HR 97 | Temp 99.6°F | Ht 65.5 in | Wt 147.6 lb

## 2020-02-21 DIAGNOSIS — K51211 Ulcerative (chronic) proctitis with rectal bleeding: Secondary | ICD-10-CM | POA: Diagnosis not present

## 2020-02-21 DIAGNOSIS — R197 Diarrhea, unspecified: Secondary | ICD-10-CM

## 2020-02-21 DIAGNOSIS — K625 Hemorrhage of anus and rectum: Secondary | ICD-10-CM

## 2020-02-21 MED ORDER — OMEPRAZOLE 40 MG PO CPDR: 40.0000 mg | DELAYED_RELEASE_CAPSULE | Freq: Every day | ORAL | 3 refills | Status: DC

## 2020-02-21 MED ORDER — DICYCLOMINE HCL 10 MG PO CAPS: 10.0000 mg | ORAL_CAPSULE | Freq: Three times a day (TID) | ORAL | 1 refills | Status: AC | PRN

## 2020-02-21 NOTE — Patient Instructions (Signed)
We are checking stool studies for evaluation.  You can use dicyclomine in interim for abdominal pain and diarrhea.  We will call with results.

## 2020-02-25 ENCOUNTER — Ambulatory Visit (INDEPENDENT_AMBULATORY_CARE_PROVIDER_SITE_OTHER): Payer: Self-pay | Admitting: Gastroenterology

## 2020-03-05 ENCOUNTER — Other Ambulatory Visit (HOSPITAL_COMMUNITY): Payer: Self-pay | Admitting: Neurosurgery

## 2020-03-05 DIAGNOSIS — M5137 Other intervertebral disc degeneration, lumbosacral region: Secondary | ICD-10-CM

## 2020-03-17 ENCOUNTER — Encounter (HOSPITAL_COMMUNITY): Payer: Self-pay

## 2020-03-17 ENCOUNTER — Ambulatory Visit (HOSPITAL_COMMUNITY): Payer: 59

## 2020-04-29 ENCOUNTER — Other Ambulatory Visit (HOSPITAL_COMMUNITY): Payer: Self-pay | Admitting: Internal Medicine

## 2020-04-29 ENCOUNTER — Other Ambulatory Visit: Payer: Self-pay | Admitting: Internal Medicine

## 2020-04-29 DIAGNOSIS — M810 Age-related osteoporosis without current pathological fracture: Secondary | ICD-10-CM

## 2020-04-29 DIAGNOSIS — Z8709 Personal history of other diseases of the respiratory system: Secondary | ICD-10-CM

## 2020-04-29 DIAGNOSIS — Z1231 Encounter for screening mammogram for malignant neoplasm of breast: Secondary | ICD-10-CM

## 2020-05-05 ENCOUNTER — Other Ambulatory Visit: Payer: Self-pay

## 2020-05-05 ENCOUNTER — Ambulatory Visit (HOSPITAL_COMMUNITY)
Admission: RE | Admit: 2020-05-05 | Discharge: 2020-05-05 | Disposition: A | Discharge status: Home / Self Care | Payer: Self-pay | Source: Ambulatory Visit | Attending: Internal Medicine | Admitting: Internal Medicine

## 2020-05-05 DIAGNOSIS — Z8709 Personal history of other diseases of the respiratory system: Secondary | ICD-10-CM | POA: Insufficient documentation

## 2020-05-05 DIAGNOSIS — M810 Age-related osteoporosis without current pathological fracture: Secondary | ICD-10-CM | POA: Insufficient documentation

## 2020-05-05 MED ORDER — IOHEXOL 300 MG/ML  SOLN
75.0000 mL | Freq: Once | INTRAMUSCULAR | Status: AC | PRN
  Administered 2020-05-05: 75 mL via INTRAVENOUS

## 2020-05-26 ENCOUNTER — Other Ambulatory Visit (HOSPITAL_COMMUNITY): Payer: Self-pay | Admitting: Internal Medicine

## 2020-05-26 ENCOUNTER — Other Ambulatory Visit: Payer: Self-pay | Admitting: Internal Medicine

## 2020-05-26 DIAGNOSIS — E042 Nontoxic multinodular goiter: Secondary | ICD-10-CM

## 2020-06-02 ENCOUNTER — Other Ambulatory Visit: Payer: Self-pay

## 2020-06-02 ENCOUNTER — Ambulatory Visit (HOSPITAL_COMMUNITY)
Admission: RE | Admit: 2020-06-02 | Discharge: 2020-06-02 | Disposition: A | Discharge status: Home / Self Care | Payer: 59 | Source: Ambulatory Visit | Attending: Internal Medicine | Admitting: Internal Medicine

## 2020-06-02 DIAGNOSIS — E042 Nontoxic multinodular goiter: Secondary | ICD-10-CM | POA: Insufficient documentation

## 2020-06-04 ENCOUNTER — Ambulatory Visit (INDEPENDENT_AMBULATORY_CARE_PROVIDER_SITE_OTHER): Payer: 59 | Admitting: Gastroenterology

## 2020-06-04 ENCOUNTER — Other Ambulatory Visit: Payer: Self-pay

## 2020-06-04 ENCOUNTER — Encounter (INDEPENDENT_AMBULATORY_CARE_PROVIDER_SITE_OTHER): Payer: Self-pay | Admitting: Gastroenterology

## 2020-06-04 ENCOUNTER — Ambulatory Visit (INDEPENDENT_AMBULATORY_CARE_PROVIDER_SITE_OTHER): Payer: Self-pay | Admitting: Gastroenterology

## 2020-06-04 VITALS — BP 138/84 | HR 99 | Temp 97.7°F | Ht 65.5 in | Wt 149.0 lb

## 2020-06-04 DIAGNOSIS — K51211 Ulcerative (chronic) proctitis with rectal bleeding: Secondary | ICD-10-CM | POA: Diagnosis not present

## 2020-06-04 MED ORDER — MESALAMINE ER 0.375 G PO CP24: 3000.0000 mg | ORAL_CAPSULE | Freq: Every day | ORAL | 5 refills | Status: DC

## 2020-06-04 MED ORDER — MESALAMINE 1000 MG RE SUPP: 1000.0000 mg | Freq: Every day | RECTAL | 2 refills | Status: DC

## 2020-06-04 NOTE — Patient Instructions (Signed)
Perform blood workup Start Apriso 3 g qday Start Canasa enema every night for one month, then continue every other night

## 2020-06-04 NOTE — Progress Notes (Signed)
Yolanda [name], M.D. Gastroenterology & Hepatology [facility] [address] [place], [place] [address] Primary Care Physician: Neale Burly, MD [place] [address]  Problems: 1.  Ulcerative proctitis  History of Present Illness: Yolanda [name] is a 55 y.o. female with PMH ulcerative proctitis, lung cancer s/p left lobe resection, COPD, hypothyroidism, depression, who presents for follow-up of ulcerative proctitis.  The patient was last seen in clinic on 02/21/2020.  Patient was prescribed dicyclomine due to abdominal pain and she was ordered to have a GI pathogen and C. difficile testing performed.  These test were never performed.  Patient states that on average she is presenting 7-8 episodes of rectal bleeding and mucus with scant amount of watery diarrhea per day.  States that her symptoms have not been controlled for several months. She also has significant symptoms of urgency and rectal tenesmus. Sometimes she feels she is going to pass gas but actually passes blood by accident. She reports occasionally she presents abdominal cramping in her lower abdomen, possibly every other day but improves after she has a BM. The patient denies having any nausea, vomiting, fever, chills,  melena, hematemesis, abdominal distention, jaundice, pruritus or weight loss.   Patient denies having any EIM.  In terms of her ulcerative proctitis, the patient was diagnosed in March 2020.  So far, she has tried mesalamine suppositories, sulfazalazine and steroid suppostories.  She has never tried oral and topical mesalamine compounds at the same time. The patient reports that she was on prednisone for 2 weeks but did not feel any improvement while taking it. She reports that she did not tolerate it well as it made her feel "moody" and would like to avoid using the medication.  Notably, the patient reports that she took Apriso for close to  two months and she  had an improvement of her symptoms.  However, she ran out of her medication and did not request any refills of the medicine.  Last DZH:GDJME Last Colonoscopy:07/14/2018, normal terminal ileum, presence of Mayo 3 inflammation characterized by spontaneous ulceration and bleeding with severe inflammation in the rectum.  Biopsies were obtained, which were consistent with colitis but no chronic changes were seen, there was presence of benign rectal polyps which were consistent with lymphoid aggregates. Last flex sig was on 12/05/2018, showing altered vascularity, edema, erosions and erythema in the rectum, worse compared to prior -  path showed changes consitent with chronic colitis but no CMV  FHx: father had ulcerative proctitis, neg for any gastrointestinal/liver disease, brother colon cancer diagnosed at 1 years. Social: former smoking quit 2019, neg alcohol or illicit drug use Surgical: no abdominal surgeries  Past Medical History: Past Medical History:  Diagnosis Date  . Anxiety   . Cancer ([place])   . Chronic pain   . COPD (chronic obstructive pulmonary disease) ([place])   . Depression   . Heart murmur   . Hemorrhoids    bleed frequently  . History of kidney stones   . Mitral valve prolapse   . Pneumonia   . Seizures ([place])   . Thyroid disease     Past Surgical History: Past Surgical History:  Procedure Laterality Date  . BACK SURGERY    . BIOPSY  07/14/2018   Procedure: BIOPSY;  Surgeon: Rogene Houston, MD;  [facility];  Service: Endoscopy;;  rectum  . BIOPSY  12/05/2018   Procedure: BIOPSY;  Surgeon: Rogene Houston, MD;  [facility];  Service: Endoscopy;;  . COLONOSCOPY  2015   [facility]  . COLONOSCOPY WITH PROPOFOL N/A 07/14/2018   Procedure: COLONOSCOPY WITH PROPOFOL;  Surgeon: Rogene Houston, MD;  [facility];  Service: Endoscopy;  Laterality: N/A;  2:20  . FLEXIBLE SIGMOIDOSCOPY N/A  12/05/2018   Procedure: FLEXIBLE SIGMOIDOSCOPY WITH PROPOFOL;  Surgeon: Rogene Houston, MD;  [facility];  Service: Endoscopy;  Laterality: N/A;  . POLYPECTOMY  07/14/2018   Procedure: POLYPECTOMY;  Surgeon: Rogene Houston, MD;  [facility];  Service: Endoscopy;;  rectum  . VIDEO ASSISTED THORACOSCOPY (VATS)/ LOBECTOMY Left 03/31/2018   Procedure: VIDEO ASSISTED THORACOSCOPY (VATS)/LEFT UPPER LOBECTOMY;  Surgeon: [name], MD;  Location: [place];  Service: Thoracic;  Laterality: Left;  . WISDOM TOOTH EXTRACTION      Family History: Family History  Problem Relation Age of Onset  . Breast cancer Mother   . Colon cancer Brother   . Clotting disorder Paternal Aunt   . Colitis Father   . Iron deficiency Father   . Depression Sister   . Alcoholism Sister   . Healthy Brother   . Healthy Daughter   . Healthy Son     Social History: Social History   Tobacco Use  Smoking Status Former Smoker  . Packs/day: 1.00  . Years: 35.00  . Pack years: 35.00  . Types: Cigarettes  . Quit date: 02/10/2018  . Years since quitting: 2.3  Smokeless Tobacco Never Used   Social History   Substance and Sexual Activity  Alcohol Use No   Social History   Substance and Sexual Activity  Drug Use No    Allergies: Allergies  Allergen Reactions  . Coconut Oil Swelling    Anything coconut  . Codeine Rash    Medications: Current Outpatient Medications  Medication Sig Dispense Refill  . acetaminophen (TYLENOL) 500 MG tablet Take 2 tablets (1,000 mg total) by mouth every 6 (six) hours. 30 tablet 0  . albuterol (PROVENTIL HFA;VENTOLIN HFA) 108 (90 Base) MCG/ACT inhaler Inhale 1-2 puffs into the lungs every 4 (four) hours as needed for wheezing or shortness of breath.    . busPIRone (BUSPAR) 15 MG tablet Take 15 mg by mouth 2 (two) times daily.    Marland Kitchen dicyclomine (BENTYL) 10 MG capsule Take 1 capsule (10 mg total) by mouth 3 (three) times daily as needed for  spasms. 90 capsule 1  . DULoxetine (CYMBALTA) 30 MG capsule Take 30 mg by mouth daily.    Marland Kitchen omeprazole (PRILOSEC) 40 MG capsule Take 1 capsule (40 mg total) by mouth daily. 30 capsule 3  . oxyCODONE-acetaminophen (PERCOCET) 10-325 MG tablet Take 1 tablet by mouth every 6 (six) hours as needed for pain.    . polyethylene glycol powder (GLYCOLAX/MIRALAX) 17 GM/SCOOP powder Take 8.5 g by mouth daily. 255 g 0  . tiZANidine (ZANAFLEX) 4 MG tablet Take 4 mg by mouth every 8 (eight) hours as needed for muscle spasms.     . verapamil (CALAN) 120 MG tablet Take 120 mg by mouth once.    . vitamin C (ASCORBIC ACID) 500 MG tablet Take 500 mg by mouth daily.     . mesalamine (APRISO) 0.375 g 24 hr capsule Take 4 capsules (1.5 g total) by mouth daily. (Patient not taking: Reported on 06/04/2020) 120 capsule 5   No current facility-administered medications for this visit.    Review of Systems: GENERAL: negative for  malaise, night sweats HEENT: No changes in hearing or vision, no nose bleeds or other nasal problems. NECK: Negative for lumps, goiter, pain and significant neck swelling RESPIRATORY: Negative for cough, wheezing CARDIOVASCULAR: Negative for chest pain, leg swelling, palpitations, orthopnea GI: SEE HPI MUSCULOSKELETAL: Negative for joint pain or swelling, back pain, and muscle pain. SKIN: Negative for lesions, rash PSYCH: Negative for sleep disturbance, mood disorder and recent psychosocial stressors. HEMATOLOGY Negative for prolonged bleeding, bruising easily, and swollen nodes. ENDOCRINE: Negative for cold or heat intolerance, polyuria, polydipsia and goiter. NEURO: negative for tremor, gait imbalance, syncope and seizures. The remainder of the review of systems is noncontributory.   Physical Exam: BP 138/84 (BP Location: Left Arm, Patient Position: Sitting, Cuff Size: Large)   Pulse 99   Temp 97.7 F (36.5 C) (Oral)   Ht 5' 5.5" (1.664 m)   Wt 149 lb (67.6 kg)   LMP 03/30/2007   BMI  24.42 kg/m  GENERAL: The patient is AO x3, in no acute distress. HEENT: Head is normocephalic and atraumatic. EOMI are intact. Mouth is well hydrated and without lesions. NECK: Supple. No masses LUNGS: Clear to auscultation. No presence of rhonchi/wheezing/rales. Adequate chest expansion HEART: RRR, normal s1 and s2. ABDOMEN: Soft, nontender, no guarding, no peritoneal signs, and nondistended. BS +. No masses. EXTREMITIES: Without any cyanosis, clubbing, rash, lesions or edema. NEUROLOGIC: AOx3, no focal motor deficit. SKIN: no jaundice, no rashes   Imaging/Labs: as above  I personally reviewed and interpreted the available labs, imaging and endoscopic files.  Impression and Plan: Jesly Hartmann is a 55 y.o. female with PMH ulcerative proctitis, lung cancer s/p left lobe resection, COPD, hypothyroidism, depression, who presents for follow-up of ulcerative proctitis.  The patient has had uncontrolled symptoms for several months.  She did not tolerate steroids and is not interested in trying this medication.  Based on primary documentation she failed monotherapy with topical mesalamine and steroid.  It seems that she had some improvement while using Apriso in the past.  At this moment, the patient will be prescribed 3 g of Apriso daily in combination with Canasa enemas every night for 1 month, followed by every other night if she presents symptom improvement.  Will obtain baseline CBC and CMP today.  There is no need for repeat endoscopic investigation at this moment.  Regardless, given her family history of colon cancer she will need to have a repeat colonoscopy every 5 years at least.  - Check CBC and CMP - Start Apriso 3 g qday - Start Canasa enema every night for one month, then continue every other night - RTC 2 months  All questions were answered.      Yolanda Peppers, MD Gastroenterology and Hepatology [facility]

## 2020-06-05 ENCOUNTER — Telehealth (INDEPENDENT_AMBULATORY_CARE_PROVIDER_SITE_OTHER): Payer: Self-pay | Admitting: Gastroenterology

## 2020-06-05 ENCOUNTER — Other Ambulatory Visit (INDEPENDENT_AMBULATORY_CARE_PROVIDER_SITE_OTHER): Payer: Self-pay | Admitting: Gastroenterology

## 2020-06-05 DIAGNOSIS — K51211 Ulcerative (chronic) proctitis with rectal bleeding: Secondary | ICD-10-CM

## 2020-06-05 LAB — COMPREHENSIVE METABOLIC PANEL
AG Ratio: 1.5 (calc) (ref 1.0–2.5)
ALT: 15 U/L (ref 6–29)
AST: 15 U/L (ref 10–35)
Albumin: 4.2 g/dL (ref 3.6–5.1)
Alkaline phosphatase (APISO): 110 U/L (ref 37–153)
BUN: 16 mg/dL (ref 7–25)
CO2: 28 mmol/L (ref 20–32)
Calcium: 9.3 mg/dL (ref 8.6–10.4)
Chloride: 106 mmol/L (ref 98–110)
Creat: 0.75 mg/dL (ref 0.50–1.05)
Globulin: 2.8 g/dL (calc) (ref 1.9–3.7)
Glucose, Bld: 106 mg/dL (ref 65–139)
Potassium: 3.9 mmol/L (ref 3.5–5.3)
Sodium: 142 mmol/L (ref 135–146)
Total Bilirubin: 0.3 mg/dL (ref 0.2–1.2)
Total Protein: 7 g/dL (ref 6.1–8.1)

## 2020-06-05 LAB — CBC WITH DIFFERENTIAL/PLATELET
Absolute Monocytes: 490 cells/uL (ref 200–950)
Basophils Absolute: 17 cells/uL (ref 0–200)
Basophils Relative: 0.3 %
Eosinophils Absolute: 91 cells/uL (ref 15–500)
Eosinophils Relative: 1.6 %
HCT: 36.7 % (ref 35.0–45.0)
Hemoglobin: 12.5 g/dL (ref 11.7–15.5)
Lymphs Abs: 1784 cells/uL (ref 850–3900)
MCH: 31.5 pg (ref 27.0–33.0)
MCHC: 34.1 g/dL (ref 32.0–36.0)
MCV: 92.4 fL (ref 80.0–100.0)
MPV: 10.3 fL (ref 7.5–12.5)
Monocytes Relative: 8.6 %
Neutro Abs: 3317 cells/uL (ref 1500–7800)
Neutrophils Relative %: 58.2 %
Platelets: 316 10*3/uL (ref 140–400)
RBC: 3.97 10*6/uL (ref 3.80–5.10)
RDW: 12.6 % (ref 11.0–15.0)
Total Lymphocyte: 31.3 %
WBC: 5.7 10*3/uL (ref 3.8–10.8)

## 2020-06-05 MED ORDER — MESALAMINE ER 0.375 G PO CP24: 3000.0000 mg | ORAL_CAPSULE | Freq: Every day | ORAL | 5 refills | Status: DC

## 2020-06-05 NOTE — Telephone Encounter (Signed)
Patient left voice mail message stating her pharmacy needs a p.a. on her refill - please advise - ph# [phone]

## 2020-06-05 NOTE — Telephone Encounter (Signed)
Patient is aware we are working on this. She states her drug store did not have in stock and she was going to call them to see if they could get it in and call us back with whether or not she needed [place] to send it in to somewhere else.

## 2020-06-06 NOTE — Telephone Encounter (Signed)
Both the Mesalamine suppositories and the Capsules are being ran through prior Auth.  The Mesalamine 0.375 mcg Eight capsules daily # 240 with five refills sent through cover my meds, but it states one is currently pending for the # 120 with 5 refills.  Per Dr. Jenetta Downer if the insurance will not pay for these the patient may need to check with her insurance and see which mesalamine they will cover. The patient will check with her insurance to see which is the preferred and will call us back with that information.

## 2020-06-09 NOTE — Telephone Encounter (Signed)
I called and left detailed message that the medication should be ready for her tomorrow at [place] per [facility] the pharmacist there.

## 2020-06-09 NOTE — Telephone Encounter (Signed)
The suppositories have been denied by patient insurance. She will call her insurance company to see which mesalamine is on formulary for them.

## 2020-06-09 NOTE — Telephone Encounter (Addendum)
Per Matt the enemas are 4 grams and they come in # 28 enemas for 28 days supply.  Matt from [facility] [phone] called to report the patient had checked with her insurance and they will cover the mesalamine enemas, and they will cover the capsules #120 tablets for a 30 day supply,but they will need a new prescription sent to [facility] for the  both of these. Please advise.

## 2020-06-09 NOTE — Telephone Encounter (Signed)
Called pharmacy to address the current situation with the mesalamine compounds.  Patient will be prescribed 1.5 g (4 capsules) of Apriso and mesalamine enemas.

## 2020-06-09 NOTE — Telephone Encounter (Signed)
Please call Matt @ [facility] regarding this patient - ph# [phone]

## 2020-07-17 ENCOUNTER — Inpatient Hospital Stay (HOSPITAL_COMMUNITY): Admission: RE | Admit: 2020-07-17 | Payer: 59 | Source: Ambulatory Visit

## 2020-07-17 ENCOUNTER — Other Ambulatory Visit (HOSPITAL_COMMUNITY): Payer: 59

## 2020-08-06 ENCOUNTER — Ambulatory Visit (INDEPENDENT_AMBULATORY_CARE_PROVIDER_SITE_OTHER): Payer: 59 | Admitting: Gastroenterology

## 2020-08-06 ENCOUNTER — Other Ambulatory Visit: Payer: Self-pay

## 2020-08-06 ENCOUNTER — Encounter (INDEPENDENT_AMBULATORY_CARE_PROVIDER_SITE_OTHER): Payer: Self-pay | Admitting: Gastroenterology

## 2020-08-06 DIAGNOSIS — K51211 Ulcerative (chronic) proctitis with rectal bleeding: Secondary | ICD-10-CM

## 2020-08-06 MED ORDER — MESALAMINE ER 0.375 G PO CP24: 1500.0000 mg | ORAL_CAPSULE | Freq: Every day | ORAL | 5 refills | Status: AC

## 2020-08-06 MED ORDER — MESALAMINE 1000 MG RE SUPP
1000.0000 mg | Freq: Every day | RECTAL | 3 refills | Status: AC
Start: 2020-08-06 — End: ?

## 2020-08-06 NOTE — Progress Notes (Signed)
Maylon Peppers, M.D. Gastroenterology & Hepatology [facility] [address] [place], [place] [address]  Primary Care Physician: Neale Burly, MD [place] [address]  I will communicate my assessment and recommendations to the referring MD via EMR.  Problems: 1. Ulcerative proctitis  History of Present Illness: Cecylia Brazill is a 55 y.o. female with PMH ulcerative proctitis, lung cancer s/p left lobe resection, COPD, hypothyroidism, depression, who presents for follow-up of ulcerative proctitis.  The patient was last seen on 06/04/2020. At that time, the patient was increased to 3 g of Apriso every day was advised to start [place]asa enemas daily for a month, then every other night.  However, the insurance only covered 1.5 g of the Apriso so she had to stay on this dosage.  States that while she was using the mesalamine enemas she achieved remission of her symptoms as she did not have any episodes of blood in her stool, or mucus, but she ran out of them couple of weeks ago. Noticed that since then, she has presented scant blood and mucus in her stool, possibly 3 times a week. Denies any diarrhea or abdominal pain. Has 1 formed BM every day.  The patient denies having any nausea, vomiting, fever, chills, melena, hematemesis, abdominal distention, abdominal pain, diarrhea, jaundice, pruritus or weight loss.  Last RXV:[id number] Last Colonoscopy:07/14/2018, normal terminal ileum, presence of Mayo 3 inflammation characterized by spontaneous ulceration and bleeding with severe inflammation in the rectum.  Biopsies were obtained, which were consistent with colitis but no chronic changes were seen, there was presence of benign rectal polyps which were consistent with lymphoid aggregates. Last flex sig was on 12/05/2018, showing altered vascularity, edema, erosions and erythema in the rectum, worse compared to prior -  path showed changes  consitent with chronic colitis but no CMV  Last flu shot: 2021  Last pneumonia shot: she received two doses, does not remember when she received Last Pap smear: has not had it in multiple  Last evaluation by dermatology: never seen them Last zoster vaccine: 2021 COVID-19 shot: has received Moderna x2 boosters  Past Medical History: Past Medical History:  Diagnosis Date  . Anxiety   . Cancer ([place])   . Chronic pain   . COPD (chronic obstructive pulmonary disease) ([place])   . Depression   . Heart murmur   . Hemorrhoids    bleed frequently  . History of kidney stones   . Mitral valve prolapse   . Pneumonia   . Seizures ([place])   . Thyroid disease     Past Surgical History: Past Surgical History:  Procedure Laterality Date  . BACK SURGERY    . BIOPSY  07/14/2018   Procedure: BIOPSY;  Surgeon: Rogene Houston, MD;  [facility];  Service: Endoscopy;;  rectum  . BIOPSY  12/05/2018   Procedure: BIOPSY;  Surgeon: Rogene Houston, MD;  [facility];  Service: Endoscopy;;  . COLONOSCOPY  2015   Dr.Demason at [facility]  . COLONOSCOPY WITH PROPOFOL N/A 07/14/2018   Procedure: COLONOSCOPY WITH PROPOFOL;  Surgeon: Rogene Houston, MD;  [facility];  Service: Endoscopy;  Laterality: N/A;  2:20  . FLEXIBLE SIGMOIDOSCOPY N/A 12/05/2018   Procedure: FLEXIBLE SIGMOIDOSCOPY WITH PROPOFOL;  Surgeon: Rogene Houston, MD;  [facility];  Service: Endoscopy;  Laterality: N/A;  . POLYPECTOMY  07/14/2018   Procedure: POLYPECTOMY;  Surgeon: Rogene Houston, MD;  [facility];  Service: Endoscopy;;  rectum  . VIDEO ASSISTED THORACOSCOPY (VATS)/ LOBECTOMY Left 03/31/2018   Procedure: VIDEO ASSISTED THORACOSCOPY (VATS)/LEFT UPPER LOBECTOMY;  Surgeon: [name], MD;  Location: [place];  Service: Thoracic;  Laterality: Left;  . WISDOM TOOTH EXTRACTION      Family History: Family History  Problem Relation Age of Onset  .  Breast cancer Mother   . Colon cancer Brother   . Clotting disorder Paternal Aunt   . Colitis Father   . Iron deficiency Father   . Depression Sister   . Alcoholism Sister   . Healthy Brother   . Healthy Daughter   . Healthy Son     Social History: Social History   Tobacco Use  Smoking Status Former Smoker  . Packs/day: 1.00  . Years: 35.00  . Pack years: 35.00  . Types: Cigarettes  . Quit date: 02/10/2018  . Years since quitting: 2.4  Smokeless Tobacco Never Used   Social History   Substance and Sexual Activity  Alcohol Use No   Social History   Substance and Sexual Activity  Drug Use No    Allergies: Allergies  Allergen Reactions  . Coconut Oil Swelling    Anything coconut  . Codeine Rash    Medications: Current Outpatient Medications  Medication Sig Dispense Refill  . acetaminophen (TYLENOL) 500 MG tablet Take 2 tablets (1,000 mg total) by mouth every 6 (six) hours. 30 tablet 0  . albuterol (PROVENTIL HFA;VENTOLIN HFA) 108 (90 Base) MCG/ACT inhaler Inhale 1-2 puffs into the lungs every 4 (four) hours as needed for wheezing or shortness of breath.    . busPIRone (BUSPAR) 15 MG tablet Take 15 mg by mouth 2 (two) times daily.    . cyanocobalamin (,VITAMIN B-12,) 1000 MCG/ML injection Inject 1,000 mcg into the muscle every 30 (thirty) days.    Marland Kitchen dicyclomine (BENTYL) 10 MG capsule Take 1 capsule (10 mg total) by mouth 3 (three) times daily as needed for spasms. 90 capsule 1  . DULoxetine (CYMBALTA) 30 MG capsule Take 60 mg by mouth daily.    . mesalamine (APRISO) 0.375 g 24 hr capsule Take 8 capsules (3 g total) by mouth daily. 240 capsule 5  . omeprazole (PRILOSEC) 40 MG capsule Take 1 capsule (40 mg total) by mouth daily. 30 capsule 3  . oxyCODONE-acetaminophen (PERCOCET) 10-325 MG tablet Take 1 tablet by mouth every 6 (six) hours as needed for pain.    . polyethylene glycol powder (GLYCOLAX/MIRALAX) 17 GM/SCOOP powder Take 8.5 g by mouth daily. 255 g 0  .  verapamil (CALAN) 120 MG tablet Take 120 mg by mouth once.    . mesalamine (CANASA) 1000 MG suppository Place 1 suppository (1,000 mg total) rectally at bedtime. Every night for one month, then decrease to every other night (Patient not taking: Reported on 08/06/2020) 30 suppository 2  . tiZANidine (ZANAFLEX) 4 MG tablet Take 4 mg by mouth every 8 (eight) hours as needed for muscle spasms.  (Patient not taking: Reported on 08/06/2020)    . vitamin C (ASCORBIC ACID) 500 MG tablet Take 500 mg by mouth daily.  (Patient not taking: Reported on 08/06/2020)     No current facility-administered medications for this visit.    Review of Systems: GENERAL: negative for malaise, night sweats HEENT: No changes in hearing or vision, no nose bleeds or other nasal problems. NECK: Negative for lumps, goiter, pain and significant neck swelling RESPIRATORY: Negative for cough, wheezing CARDIOVASCULAR: Negative  for chest pain, leg swelling, palpitations, orthopnea GI: SEE HPI MUSCULOSKELETAL: Negative for joint pain or swelling, back pain, and muscle pain. SKIN: Negative for lesions, rash PSYCH: Negative for sleep disturbance, mood disorder and recent psychosocial stressors. HEMATOLOGY Negative for prolonged bleeding, bruising easily, and swollen nodes. ENDOCRINE: Negative for cold or heat intolerance, polyuria, polydipsia and goiter. NEURO: negative for tremor, gait imbalance, syncope and seizures. The remainder of the review of systems is noncontributory.   Physical Exam: BP 135/82 (BP Location: Left Arm, Patient Position: Sitting, Cuff Size: Small)   Pulse 86   Temp 98.1 F (36.7 C) (Oral)   Ht 5' 5.5" (1.664 m)   Wt 147 lb (66.7 kg)   LMP 03/30/2007   BMI 24.09 kg/m  GENERAL: The patient is AO x3, in no acute distress. HEENT: Head is normocephalic and atraumatic. EOMI are intact. Mouth is well hydrated and without lesions. NECK: Supple. No masses LUNGS: Clear to auscultation. No presence of  rhonchi/wheezing/rales. Adequate chest expansion HEART: RRR, normal s1 and s2. ABDOMEN: Soft, nontender, no guarding, no peritoneal signs, and nondistended. BS +. No masses. EXTREMITIES: Without any cyanosis, clubbing, rash, lesions or edema. NEUROLOGIC: AOx3, no focal motor deficit. SKIN: no jaundice, no rashes  Imaging/Labs: as above  I personally reviewed and interpreted the available labs, imaging and endoscopic files.  Impression and Plan: Shanique Aslinger is a 55 y.o. female with PMH ulcerative proctitis, lung cancer s/p left lobe resection, COPD, hypothyroidism, depression, who presents for follow-up of ulcerative proctitis.  The patient has presented major improvement of her symptoms while combining both oral and topical mesalamine compounds.  After running out of the topical mesalamine she has had mild recurrence of her symptoms.  I advised her to continue on the current dose of Apriso but she will need to go back on mesalamine enemas every night which she is comfortable doing.  We will try to continue this for a total of 2 months and then decrease to every other day dosing indefinitely as she may need to be on topical medications chronically.  Patient understood and agreed.  She will need a repeat colonoscopy in 2025 given family history of colon cancer.  Finally, in terms of prevention, she will need to follow-up with her GYN for Pap smear and she will be referred to a dermatologist for annual skin cancer screening.  - Continue Apriso 4 tablets every day - Restart mesalamine enemas every night for two months, can decrease to every other night afterwards based on symptom control - Follow up with Gyn regarding Pap smear - Dermatology referral - RTC 6 months All questions were answered.      Harvel Quale, MD Gastroenterology and Hepatology [facility]

## 2020-08-06 NOTE — Patient Instructions (Addendum)
Continue Apriso 4 tablets every day Restart mesalamine enemas every night for two months, can decrease to every other night afterwards based on symptom control Follow up with Gyn regarding Pap smear Dermatology referral

## 2020-08-25 ENCOUNTER — Telehealth (INDEPENDENT_AMBULATORY_CARE_PROVIDER_SITE_OTHER): Payer: Self-pay

## 2020-08-25 NOTE — Telephone Encounter (Signed)
Thanks

## 2020-08-25 NOTE — Telephone Encounter (Signed)
Per Patient she has the Mesalamine enemas. She states the insurance paid for all except $90.00 and she bought them.

## 2020-09-02 ENCOUNTER — Other Ambulatory Visit (INDEPENDENT_AMBULATORY_CARE_PROVIDER_SITE_OTHER): Payer: Self-pay

## 2020-09-02 DIAGNOSIS — K219 Gastro-esophageal reflux disease without esophagitis: Secondary | ICD-10-CM

## 2020-09-02 MED ORDER — OMEPRAZOLE 40 MG PO CPDR: 40.0000 mg | DELAYED_RELEASE_CAPSULE | Freq: Every day | ORAL | 3 refills | Status: AC

## 2021-02-05 ENCOUNTER — Ambulatory Visit (INDEPENDENT_AMBULATORY_CARE_PROVIDER_SITE_OTHER): Payer: 59 | Admitting: Gastroenterology

## 2021-02-05 ENCOUNTER — Encounter (INDEPENDENT_AMBULATORY_CARE_PROVIDER_SITE_OTHER): Payer: Self-pay | Admitting: Gastroenterology

## 2021-07-16 IMAGING — US US THYROID
1 series · 12 of 25 positions shown · non-contrast
Comparison: CT of the chest on 05/05/2020

CLINICAL DATA: Incidental on CT.

EXAM:
THYROID ULTRASOUND
TECHNIQUE: Ultrasound examination of the thyroid gland and adjacent soft
tissues was performed.

[Series 1: us thyroid · 12 of 92 slices shown]
[im 4/92]
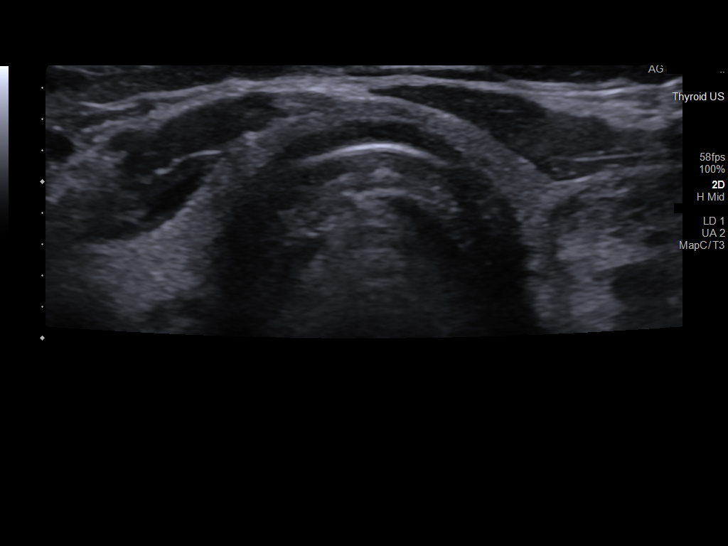
[im 12/92]
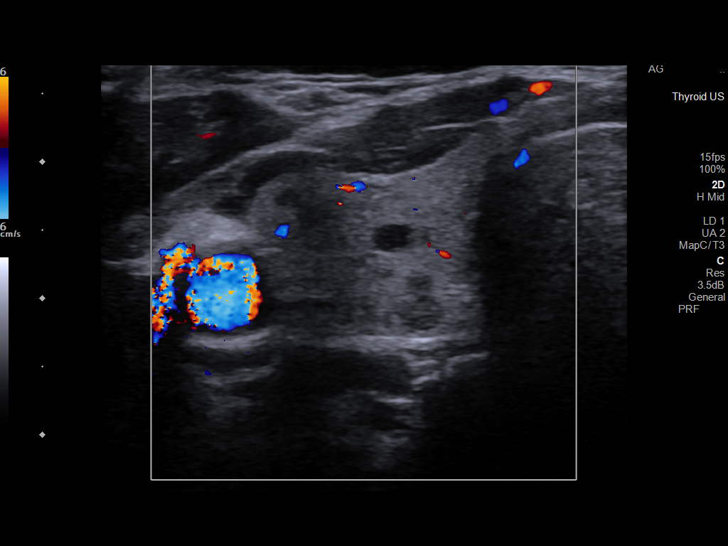
[im 19/92]
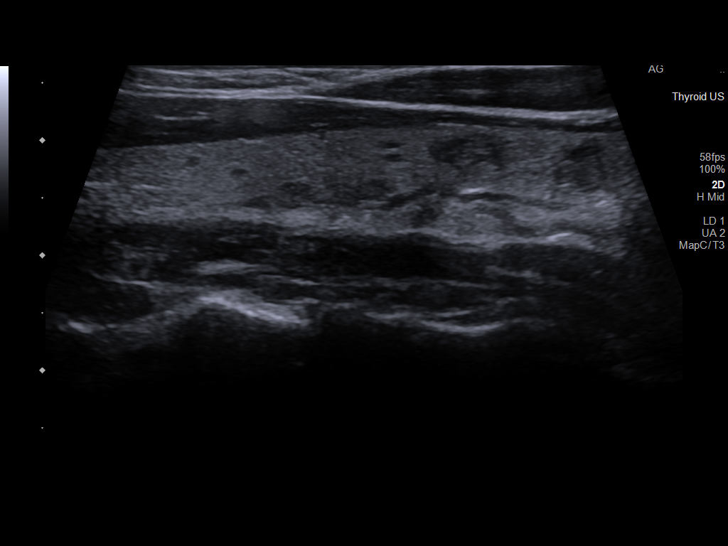
[im 27/92]
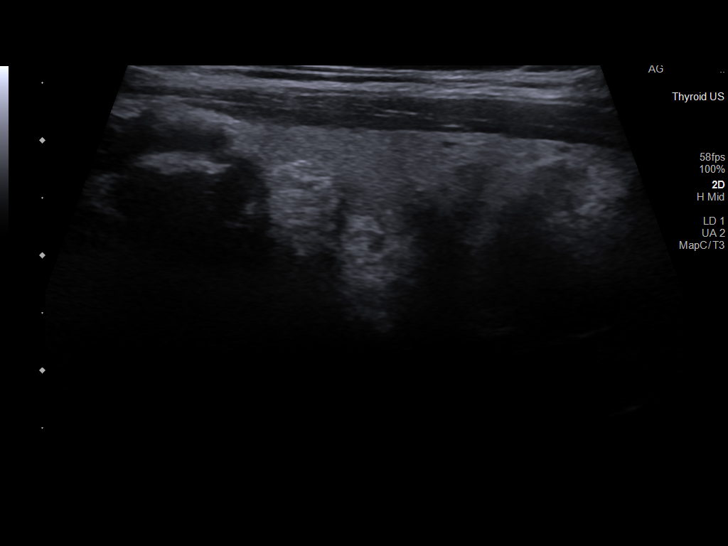
[im 35/92]
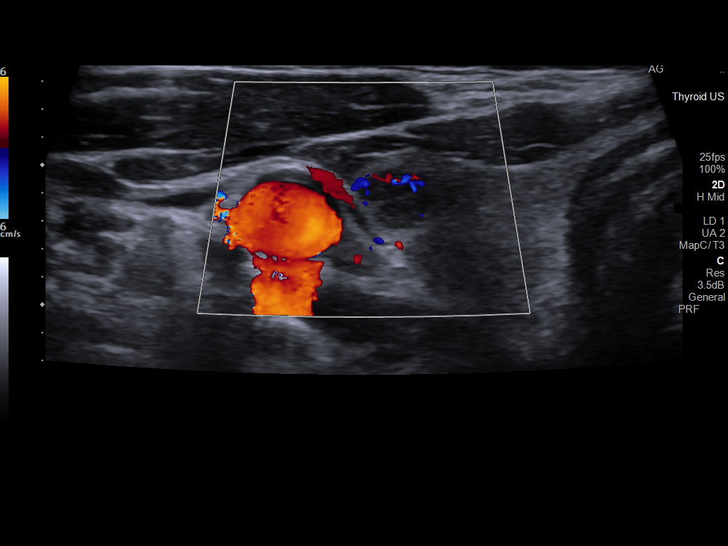
[im 42/92]
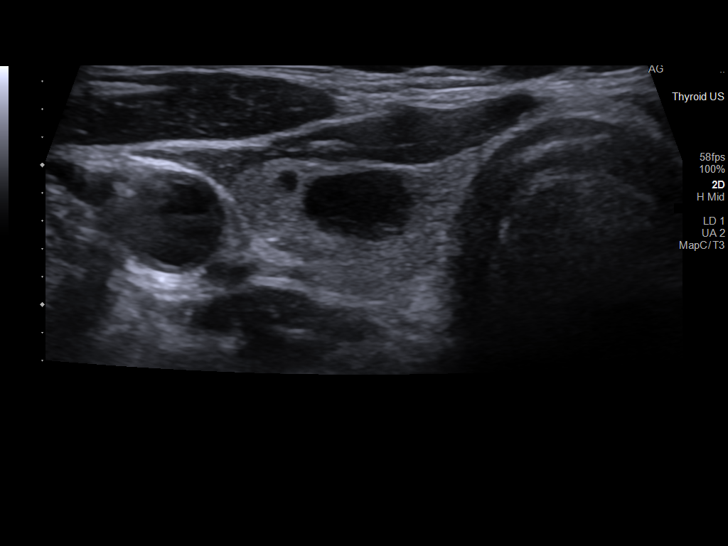
[im 50/92]
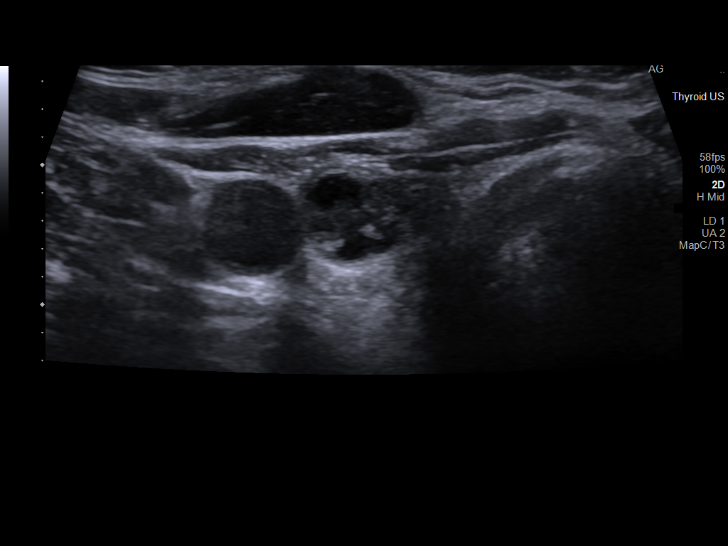
[im 57/92]
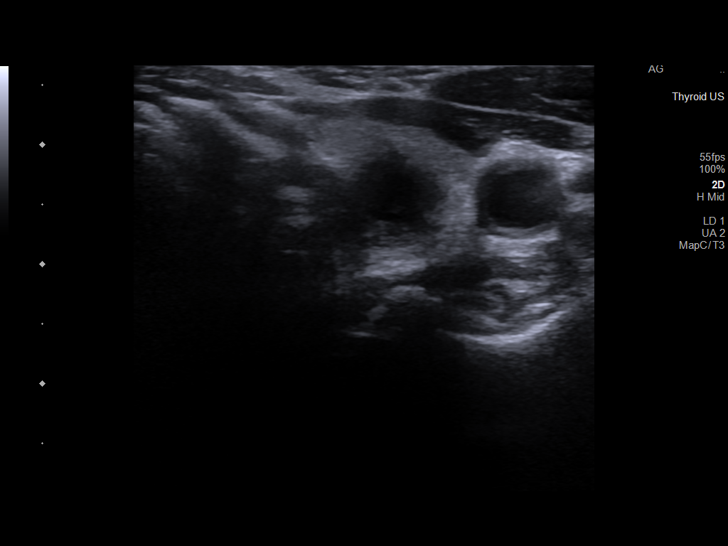
[im 65/92]
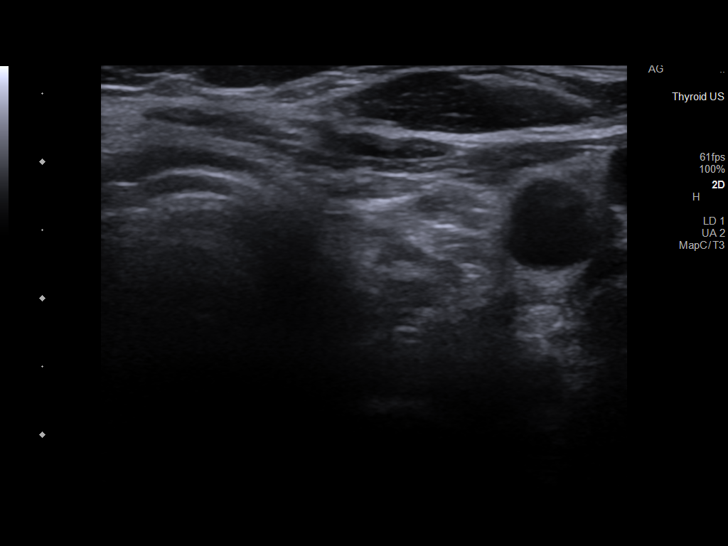
[im 73/92]
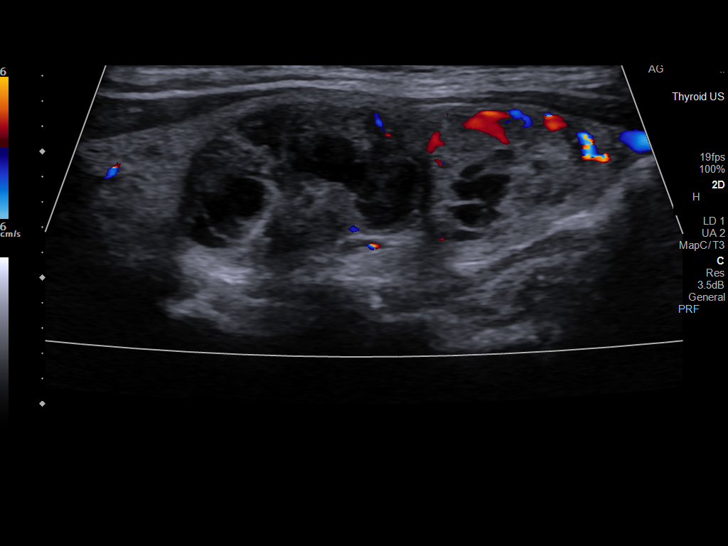
[im 80/92]
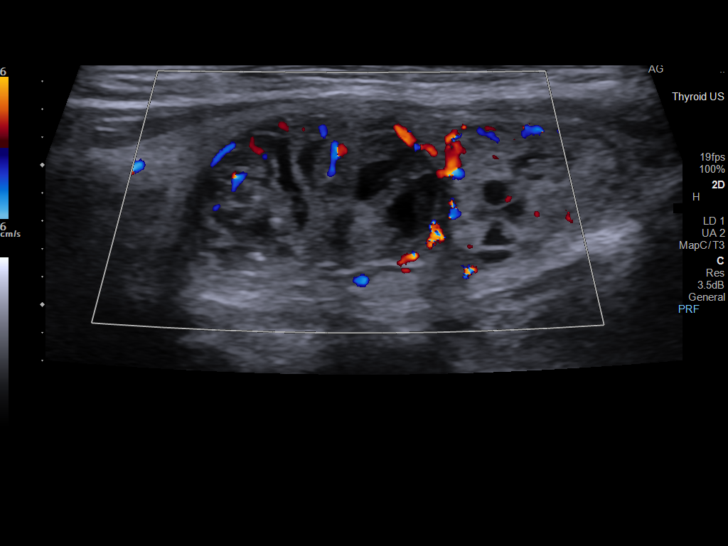
[im 88/92]
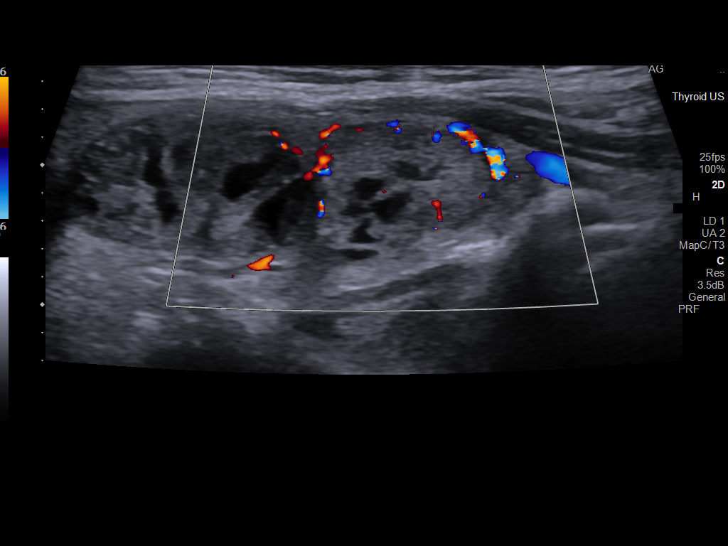

[12 of 25 positions shown; findings below may reference images not displayed]

FINDINGS: Parenchymal Echotexture: Mildly heterogenous

Isthmus: 0.2 cm

Right lobe: 5.5 x 1.4 x 1.6 cm

Left lobe: 4.4 x 1.5 x 2.1 cm

_________________________________________________________

Estimated total number of nodules >/= 1 cm: 2

Number of spongiform nodules >/=  2 cm not described below (TR1): 0

Number of mixed cystic and solid nodules >/= 1.5 cm not described
below (TR2): 0

_________________________________________________________

Nodule # 1:

Location: Right; Mid

Maximum size: 0.7 cm; Other 2 dimensions: 0.5 x 0.5 cm

Composition: solid/almost completely solid (2)

Echogenicity: hypoechoic (2)

Shape: not taller-than-wide (0)

Margins: smooth (0)

Echogenic foci: none (0)

ACR TI-RADS total points: 4.

ACR TI-RADS risk category: TR4 (4-6 points).

ACR TI-RADS recommendations:

Given size (<0.9 cm) and appearance, this nodule does NOT meet
TI-RADS criteria for biopsy or dedicated follow-up.

_________________________________________________________

Nodule # 3:

Location: Right; Inferior

Maximum size: 0.8 cm; Other 2 dimensions: 0.7 x 0.6 cm

Composition: spongiform (0)

Echogenicity: anechoic (0)

Shape: not taller-than-wide (0)

Margins: smooth (0)

Echogenic foci: none (0)

ACR TI-RADS total points: 0.

ACR TI-RADS risk category: TR1 (0-1 points).

ACR TI-RADS recommendations:

This nodule does NOT meet TI-RADS criteria for biopsy or dedicated
follow-up.

_________________________________________________________

Nodule # 4:

Location: Left; Mid

Maximum size: 2.1 cm; Other 2 dimensions: 1.4 x 1.5 cm

Composition: spongiform (0)

Echogenicity: anechoic (0)

Shape: not taller-than-wide (0)

Margins: smooth (0)

Echogenic foci: none (0)

ACR TI-RADS total points: 0.

ACR TI-RADS risk category: TR1 (0-1 points).

ACR TI-RADS recommendations:

This nodule does NOT meet TI-RADS criteria for biopsy or dedicated
follow-up.

_________________________________________________________

Nodule # 5:

Location: Left; Inferior

Maximum size: 1.4 cm; Other 2 dimensions: 1.3 x 1.0 cm

Composition: mixed cystic and solid (1)

Echogenicity: isoechoic (1)

Shape: not taller-than-wide (0)

Margins: smooth (0)

Echogenic foci: none (0)

ACR TI-RADS total points: 2.

ACR TI-RADS risk category: TR2 (2 points).

ACR TI-RADS recommendations:

This nodule does NOT meet TI-RADS criteria for biopsy or dedicated
follow-up.

_________________________________________________________

Additional 0.9 cm cyst in the right lobe is benign.

No abnormal lymph nodes identified.
IMPRESSION: Thyroid nodules as characterized above. None meet criteria for
biopsy or dedicated follow-up.

The above is in keeping with the ACR TI-RADS recommendations - [HOSPITAL] 2804;[DATE].

## 2021-07-29 ENCOUNTER — Other Ambulatory Visit: Payer: Self-pay | Admitting: Internal Medicine

## 2021-07-29 ENCOUNTER — Other Ambulatory Visit (HOSPITAL_COMMUNITY): Payer: Self-pay | Admitting: Internal Medicine

## 2021-07-29 DIAGNOSIS — J449 Chronic obstructive pulmonary disease, unspecified: Secondary | ICD-10-CM

## 2021-08-24 ENCOUNTER — Ambulatory Visit (HOSPITAL_COMMUNITY)
Admission: RE | Admit: 2021-08-24 | Discharge: 2021-08-24 | Disposition: A | Discharge status: Home / Self Care | Payer: 59 | Source: Ambulatory Visit | Attending: Internal Medicine | Admitting: Internal Medicine

## 2021-08-24 DIAGNOSIS — J449 Chronic obstructive pulmonary disease, unspecified: Secondary | ICD-10-CM | POA: Insufficient documentation

## 2022-01-20 DIAGNOSIS — G609 Hereditary and idiopathic neuropathy, unspecified: Secondary | ICD-10-CM | POA: Diagnosis not present

## 2022-01-20 DIAGNOSIS — Z79891 Long term (current) use of opiate analgesic: Secondary | ICD-10-CM | POA: Diagnosis not present

## 2022-01-20 DIAGNOSIS — M542 Cervicalgia: Secondary | ICD-10-CM | POA: Diagnosis not present

## 2022-01-20 DIAGNOSIS — M461 Sacroiliitis, not elsewhere classified: Secondary | ICD-10-CM | POA: Diagnosis not present

## 2022-01-20 DIAGNOSIS — M545 Low back pain, unspecified: Secondary | ICD-10-CM | POA: Diagnosis not present

## 2022-01-20 DIAGNOSIS — M5416 Radiculopathy, lumbar region: Secondary | ICD-10-CM | POA: Diagnosis not present

## 2022-01-20 DIAGNOSIS — I10 Essential (primary) hypertension: Secondary | ICD-10-CM | POA: Diagnosis not present

## 2022-01-21 DIAGNOSIS — Z0000 Encounter for general adult medical examination without abnormal findings: Secondary | ICD-10-CM | POA: Diagnosis not present

## 2022-01-21 DIAGNOSIS — L0291 Cutaneous abscess, unspecified: Secondary | ICD-10-CM | POA: Diagnosis not present

## 2022-01-21 DIAGNOSIS — Z6822 Body mass index (BMI) 22.0-22.9, adult: Secondary | ICD-10-CM | POA: Diagnosis not present

## 2022-01-21 DIAGNOSIS — D0222 Carcinoma in situ of left bronchus and lung: Secondary | ICD-10-CM | POA: Diagnosis not present

## 2022-01-21 DIAGNOSIS — I700 Atherosclerosis of aorta: Secondary | ICD-10-CM | POA: Diagnosis not present

## 2022-04-07 DIAGNOSIS — M5416 Radiculopathy, lumbar region: Secondary | ICD-10-CM | POA: Diagnosis not present

## 2022-04-07 DIAGNOSIS — M545 Low back pain, unspecified: Secondary | ICD-10-CM | POA: Diagnosis not present

## 2022-04-07 DIAGNOSIS — M542 Cervicalgia: Secondary | ICD-10-CM | POA: Diagnosis not present

## 2022-04-07 DIAGNOSIS — G609 Hereditary and idiopathic neuropathy, unspecified: Secondary | ICD-10-CM | POA: Diagnosis not present

## 2022-04-07 DIAGNOSIS — I10 Essential (primary) hypertension: Secondary | ICD-10-CM | POA: Diagnosis not present

## 2022-04-07 DIAGNOSIS — Z79891 Long term (current) use of opiate analgesic: Secondary | ICD-10-CM | POA: Diagnosis not present

## 2022-06-07 DIAGNOSIS — J449 Chronic obstructive pulmonary disease, unspecified: Secondary | ICD-10-CM | POA: Diagnosis not present

## 2022-06-07 DIAGNOSIS — I700 Atherosclerosis of aorta: Secondary | ICD-10-CM | POA: Diagnosis not present

## 2022-06-07 DIAGNOSIS — I10 Essential (primary) hypertension: Secondary | ICD-10-CM | POA: Diagnosis not present

## 2022-06-07 DIAGNOSIS — L03211 Cellulitis of face: Secondary | ICD-10-CM | POA: Diagnosis not present

## 2022-06-07 DIAGNOSIS — E7849 Other hyperlipidemia: Secondary | ICD-10-CM | POA: Diagnosis not present

## 2022-06-07 DIAGNOSIS — Z6824 Body mass index (BMI) 24.0-24.9, adult: Secondary | ICD-10-CM | POA: Diagnosis not present

## 2022-06-07 DIAGNOSIS — D0222 Carcinoma in situ of left bronchus and lung: Secondary | ICD-10-CM | POA: Diagnosis not present

## 2022-06-07 DIAGNOSIS — F411 Generalized anxiety disorder: Secondary | ICD-10-CM | POA: Diagnosis not present

## 2022-09-28 DIAGNOSIS — Z79891 Long term (current) use of opiate analgesic: Secondary | ICD-10-CM | POA: Diagnosis not present

## 2022-09-28 DIAGNOSIS — Z79899 Other long term (current) drug therapy: Secondary | ICD-10-CM | POA: Diagnosis not present

## 2022-09-28 DIAGNOSIS — M5416 Radiculopathy, lumbar region: Secondary | ICD-10-CM | POA: Diagnosis not present

## 2022-09-28 DIAGNOSIS — G894 Chronic pain syndrome: Secondary | ICD-10-CM | POA: Diagnosis not present

## 2022-09-28 DIAGNOSIS — G629 Polyneuropathy, unspecified: Secondary | ICD-10-CM | POA: Diagnosis not present

## 2022-09-29 DIAGNOSIS — F411 Generalized anxiety disorder: Secondary | ICD-10-CM | POA: Diagnosis not present

## 2022-09-29 DIAGNOSIS — E7849 Other hyperlipidemia: Secondary | ICD-10-CM | POA: Diagnosis not present

## 2022-09-29 DIAGNOSIS — I700 Atherosclerosis of aorta: Secondary | ICD-10-CM | POA: Diagnosis not present

## 2022-09-29 DIAGNOSIS — G608 Other hereditary and idiopathic neuropathies: Secondary | ICD-10-CM | POA: Diagnosis not present

## 2022-09-29 DIAGNOSIS — I10 Essential (primary) hypertension: Secondary | ICD-10-CM | POA: Diagnosis not present

## 2022-09-29 DIAGNOSIS — J449 Chronic obstructive pulmonary disease, unspecified: Secondary | ICD-10-CM | POA: Diagnosis not present

## 2022-09-29 DIAGNOSIS — Z6824 Body mass index (BMI) 24.0-24.9, adult: Secondary | ICD-10-CM | POA: Diagnosis not present

## 2022-09-29 DIAGNOSIS — Z85118 Personal history of other malignant neoplasm of bronchus and lung: Secondary | ICD-10-CM | POA: Diagnosis not present

## 2022-10-25 DIAGNOSIS — J40 Bronchitis, not specified as acute or chronic: Secondary | ICD-10-CM | POA: Diagnosis not present

## 2022-10-25 DIAGNOSIS — E041 Nontoxic single thyroid nodule: Secondary | ICD-10-CM | POA: Diagnosis not present

## 2022-10-25 DIAGNOSIS — Z720 Tobacco use: Secondary | ICD-10-CM | POA: Diagnosis not present

## 2022-10-25 DIAGNOSIS — C3490 Malignant neoplasm of unspecified part of unspecified bronchus or lung: Secondary | ICD-10-CM | POA: Diagnosis not present

## 2022-10-25 DIAGNOSIS — R911 Solitary pulmonary nodule: Secondary | ICD-10-CM | POA: Diagnosis not present

## 2022-10-25 DIAGNOSIS — Z85118 Personal history of other malignant neoplasm of bronchus and lung: Secondary | ICD-10-CM | POA: Diagnosis not present

## 2022-10-26 DIAGNOSIS — G629 Polyneuropathy, unspecified: Secondary | ICD-10-CM | POA: Diagnosis not present

## 2022-10-26 DIAGNOSIS — M5416 Radiculopathy, lumbar region: Secondary | ICD-10-CM | POA: Diagnosis not present

## 2022-10-26 DIAGNOSIS — G894 Chronic pain syndrome: Secondary | ICD-10-CM | POA: Diagnosis not present

## 2022-11-23 DIAGNOSIS — M5416 Radiculopathy, lumbar region: Secondary | ICD-10-CM | POA: Diagnosis not present

## 2022-11-23 DIAGNOSIS — G894 Chronic pain syndrome: Secondary | ICD-10-CM | POA: Diagnosis not present

## 2022-11-23 DIAGNOSIS — G629 Polyneuropathy, unspecified: Secondary | ICD-10-CM | POA: Diagnosis not present

## 2023-01-10 DIAGNOSIS — M5416 Radiculopathy, lumbar region: Secondary | ICD-10-CM | POA: Diagnosis not present

## 2023-01-10 DIAGNOSIS — G629 Polyneuropathy, unspecified: Secondary | ICD-10-CM | POA: Diagnosis not present

## 2023-01-10 DIAGNOSIS — G894 Chronic pain syndrome: Secondary | ICD-10-CM | POA: Diagnosis not present

## 2023-01-10 DIAGNOSIS — Z79891 Long term (current) use of opiate analgesic: Secondary | ICD-10-CM | POA: Diagnosis not present

## 2023-03-02 DIAGNOSIS — J449 Chronic obstructive pulmonary disease, unspecified: Secondary | ICD-10-CM | POA: Diagnosis not present

## 2023-03-02 DIAGNOSIS — I10 Essential (primary) hypertension: Secondary | ICD-10-CM | POA: Diagnosis not present

## 2023-03-02 DIAGNOSIS — Z0000 Encounter for general adult medical examination without abnormal findings: Secondary | ICD-10-CM | POA: Diagnosis not present

## 2023-03-02 DIAGNOSIS — E7849 Other hyperlipidemia: Secondary | ICD-10-CM | POA: Diagnosis not present

## 2023-03-02 DIAGNOSIS — Z85118 Personal history of other malignant neoplasm of bronchus and lung: Secondary | ICD-10-CM | POA: Diagnosis not present

## 2023-03-02 DIAGNOSIS — Z6824 Body mass index (BMI) 24.0-24.9, adult: Secondary | ICD-10-CM | POA: Diagnosis not present

## 2023-03-02 DIAGNOSIS — G608 Other hereditary and idiopathic neuropathies: Secondary | ICD-10-CM | POA: Diagnosis not present

## 2023-03-02 DIAGNOSIS — K760 Fatty (change of) liver, not elsewhere classified: Secondary | ICD-10-CM | POA: Diagnosis not present

## 2023-03-02 DIAGNOSIS — I700 Atherosclerosis of aorta: Secondary | ICD-10-CM | POA: Diagnosis not present

## 2023-04-04 DIAGNOSIS — M5416 Radiculopathy, lumbar region: Secondary | ICD-10-CM | POA: Diagnosis not present

## 2023-04-04 DIAGNOSIS — Z79891 Long term (current) use of opiate analgesic: Secondary | ICD-10-CM | POA: Diagnosis not present

## 2023-04-04 DIAGNOSIS — M545 Low back pain, unspecified: Secondary | ICD-10-CM | POA: Diagnosis not present

## 2023-04-04 DIAGNOSIS — G894 Chronic pain syndrome: Secondary | ICD-10-CM | POA: Diagnosis not present

## 2023-04-04 DIAGNOSIS — G629 Polyneuropathy, unspecified: Secondary | ICD-10-CM | POA: Diagnosis not present

## 2023-09-26 DIAGNOSIS — G629 Polyneuropathy, unspecified: Secondary | ICD-10-CM | POA: Diagnosis not present

## 2023-09-26 DIAGNOSIS — M5416 Radiculopathy, lumbar region: Secondary | ICD-10-CM | POA: Diagnosis not present

## 2023-09-26 DIAGNOSIS — G894 Chronic pain syndrome: Secondary | ICD-10-CM | POA: Diagnosis not present

## 2023-09-26 DIAGNOSIS — Z79891 Long term (current) use of opiate analgesic: Secondary | ICD-10-CM | POA: Diagnosis not present

## 2023-09-26 DIAGNOSIS — M545 Low back pain, unspecified: Secondary | ICD-10-CM | POA: Diagnosis not present

## 2024-02-21 DIAGNOSIS — Z79891 Long term (current) use of opiate analgesic: Secondary | ICD-10-CM | POA: Diagnosis not present

## 2024-02-21 DIAGNOSIS — G629 Polyneuropathy, unspecified: Secondary | ICD-10-CM | POA: Diagnosis not present

## 2024-02-21 DIAGNOSIS — M545 Low back pain, unspecified: Secondary | ICD-10-CM | POA: Diagnosis not present

## 2024-02-21 DIAGNOSIS — G894 Chronic pain syndrome: Secondary | ICD-10-CM | POA: Diagnosis not present

## 2024-02-21 DIAGNOSIS — M5416 Radiculopathy, lumbar region: Secondary | ICD-10-CM | POA: Diagnosis not present
